# Patient Record
Sex: Female | Born: 1960
Health system: Southern US, Community
[De-identification: ages and names within clinical notes are randomized; demographics above are authoritative.]

## PROBLEM LIST (undated history)

## (undated) DIAGNOSIS — M170 Bilateral primary osteoarthritis of knee: Secondary | ICD-10-CM

## (undated) DIAGNOSIS — R7301 Impaired fasting glucose: Secondary | ICD-10-CM

## (undated) DIAGNOSIS — S43109A Unspecified dislocation of unspecified acromioclavicular joint, initial encounter: Secondary | ICD-10-CM

## (undated) DIAGNOSIS — Z86010 Personal history of colonic polyps: Secondary | ICD-10-CM

## (undated) DIAGNOSIS — S62209A Unspecified fracture of first metacarpal bone, unspecified hand, initial encounter for closed fracture: Secondary | ICD-10-CM

## (undated) DIAGNOSIS — Z9289 Personal history of other medical treatment: Secondary | ICD-10-CM

## (undated) DIAGNOSIS — Z8619 Personal history of other infectious and parasitic diseases: Secondary | ICD-10-CM

## (undated) DIAGNOSIS — L255 Unspecified contact dermatitis due to plants, except food: Secondary | ICD-10-CM

## (undated) DIAGNOSIS — I10 Essential (primary) hypertension: Secondary | ICD-10-CM

## (undated) DIAGNOSIS — N182 Chronic kidney disease, stage 2 (mild): Secondary | ICD-10-CM

## (undated) DIAGNOSIS — M159 Polyosteoarthritis, unspecified: Secondary | ICD-10-CM

## (undated) DIAGNOSIS — K760 Fatty (change of) liver, not elsewhere classified: Secondary | ICD-10-CM

## (undated) DIAGNOSIS — E039 Hypothyroidism, unspecified: Secondary | ICD-10-CM

## (undated) DIAGNOSIS — L237 Allergic contact dermatitis due to plants, except food: Secondary | ICD-10-CM

## (undated) DIAGNOSIS — K8020 Calculus of gallbladder without cholecystitis without obstruction: Secondary | ICD-10-CM

## (undated) DIAGNOSIS — A630 Anogenital (venereal) warts: Secondary | ICD-10-CM

## (undated) HISTORY — DX: Impaired fasting glucose: R73.01

## (undated) HISTORY — DX: Personal history of other medical treatment: Z92.89

## (undated) HISTORY — PX: COLONOSCOPY: SHX174

## (undated) HISTORY — DX: Personal history of colonic polyps: Z86.010

## (undated) HISTORY — DX: Polyosteoarthritis, unspecified: M15.9

## (undated) HISTORY — DX: Anogenital (venereal) warts: A63.0

## (undated) HISTORY — DX: Chronic kidney disease, stage 2 (mild): N18.2

## (undated) HISTORY — DX: Bilateral primary osteoarthritis of knee: M17.0

## (undated) HISTORY — DX: Unspecified contact dermatitis due to plants, except food: L25.5

## (undated) HISTORY — DX: Unspecified dislocation of unspecified acromioclavicular joint, initial encounter: S43.109A

## (undated) HISTORY — DX: Hypothyroidism, unspecified: E03.9

## (undated) HISTORY — DX: Unspecified fracture of first metacarpal bone, unspecified hand, initial encounter for closed fracture: S62.209A

## (undated) HISTORY — DX: Essential (primary) hypertension: I10

## (undated) HISTORY — DX: Personal history of other infectious and parasitic diseases: Z86.19

## (undated) HISTORY — DX: Allergic contact dermatitis due to plants, except food: L23.7

---
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
## 1982-09-14 DIAGNOSIS — Z9289 Personal history of other medical treatment: Secondary | ICD-10-CM

## 1982-09-14 HISTORY — PX: DILATION AND CURETTAGE OF UTERUS: SHX78

## 1982-09-14 HISTORY — DX: Personal history of other medical treatment: Z92.89

## 1984-09-14 HISTORY — PX: TUBAL LIGATION: SHX77

## 1985-09-14 HISTORY — PX: ABDOMINAL HYSTERECTOMY: SHX81

## 2009-09-14 DIAGNOSIS — Z860101 Personal history of adenomatous and serrated colon polyps: Secondary | ICD-10-CM

## 2009-09-14 DIAGNOSIS — Z86010 Personal history of colonic polyps: Secondary | ICD-10-CM

## 2009-09-14 HISTORY — DX: Personal history of adenomatous and serrated colon polyps: Z86.0101

## 2009-09-14 HISTORY — DX: Personal history of colonic polyps: Z86.010

## 2010-09-14 HISTORY — PX: OOPHORECTOMY: SHX86

## 2011-09-15 HISTORY — PX: TREATMENT FISTULA ANAL: SUR1390

## 2014-12-25 DIAGNOSIS — E039 Hypothyroidism, unspecified: Secondary | ICD-10-CM | POA: Insufficient documentation

## 2015-09-17 DIAGNOSIS — M1712 Unilateral primary osteoarthritis, left knee: Secondary | ICD-10-CM | POA: Insufficient documentation

## 2017-02-12 DIAGNOSIS — N182 Chronic kidney disease, stage 2 (mild): Secondary | ICD-10-CM

## 2017-02-12 HISTORY — DX: Chronic kidney disease, stage 2 (mild): N18.2

## 2017-02-26 ENCOUNTER — Encounter: Payer: Self-pay | Admitting: Family Medicine

## 2017-02-26 ENCOUNTER — Other Ambulatory Visit: Payer: Self-pay | Admitting: Family Medicine

## 2017-02-26 ENCOUNTER — Ambulatory Visit (INDEPENDENT_AMBULATORY_CARE_PROVIDER_SITE_OTHER): Payer: BLUE CROSS/BLUE SHIELD | Admitting: Family Medicine

## 2017-02-26 VITALS — BP 124/84 | HR 78 | Temp 98.0°F | Resp 16 | Ht 64.75 in | Wt 217.8 lb

## 2017-02-26 DIAGNOSIS — I10 Essential (primary) hypertension: Secondary | ICD-10-CM

## 2017-02-26 DIAGNOSIS — E039 Hypothyroidism, unspecified: Secondary | ICD-10-CM | POA: Diagnosis not present

## 2017-02-26 LAB — BASIC METABOLIC PANEL
BUN: 16 mg/dL (ref 6–23)
CHLORIDE: 106 meq/L (ref 96–112)
CO2: 28 mEq/L (ref 19–32)
CREATININE: 0.96 mg/dL (ref 0.40–1.20)
Calcium: 9.7 mg/dL (ref 8.4–10.5)
GFR: 63.59 mL/min (ref >60.00)
Glucose, Bld: 128 mg/dL — ABNORMAL HIGH (ref 70–99)
Potassium: 4.4 mEq/L (ref 3.5–5.1)
Sodium: 144 mEq/L (ref 135–145)

## 2017-02-26 LAB — TSH: TSH: 0.38 u[IU]/mL (ref 0.35–4.50)

## 2017-02-26 MED ORDER — LEVOTHYROXINE SODIUM 125 MCG PO TABS: 125.0000 ug | ORAL_TABLET | Freq: Every day | ORAL | 3 refills | Status: DC

## 2017-02-26 MED ORDER — AMLODIPINE BESYLATE 5 MG PO TABS: 5.0000 mg | ORAL_TABLET | Freq: Every day | ORAL | 3 refills | Status: DC

## 2017-02-26 NOTE — Progress Notes (Signed)
Office Note 03/01/2017  CC:  Chief Complaint  Patient presents with  . Establish Care  . Follow-up    thyroid and HTN   HPI:  Connie Thompson is a 56 y.o. female who is here to establish care Patient's most recent primary MD: [place]. Old records were not reviewed prior to or during today's visit.  Last labs were done 4 mo ago.  TSH must have been a little low, pt was told to take med 6 d/week instead of daily but she chose to continue daily treatment.  Takes levothyroxine daily/correctly.  Home monitoring of BP: not too much lately due to recent move/too busy and distracted.  Most recent pap was this year (2018).  Most recent mammogram was 2017.   Past Medical History:  Diagnosis Date  . Arthritis   . Chronic renal insufficiency, stage 2 (mild) 02/2017   GFR 60s  . Genital warts   . History of adenomatous polyp of colon 2011   Repeat colonoscopy 2017 normal.  Recall 2022.  [name]. History of blood transfusion 1984  . Hypertension   . Hypothyroidism     Past Surgical History:  Procedure Laterality Date  . ABDOMINAL HYSTERECTOMY  1987  . COLONOSCOPY  age 56 and again in 2017   Polyps on initial screening colonoscopy, no polyps 2017.  Recall 2022.  [name]. DILATION AND CURETTAGE OF UTERUS  1984   Molar Pregnancy  . OOPHORECTOMY Right 2012  . TONSILLECTOMY  1973  . TREATMENT FISTULA ANAL  2013  . TUBAL LIGATION  1986  . WISDOM TOOTH EXTRACTION  1975    Family History  Problem Relation Age of Onset  . Alcohol abuse Mother   . Stroke Mother   . Hypertension Mother   . Alcohol abuse Father   . Hypertension Father   . COPD Brother   . Lung cancer Maternal Uncle        smoker  . Alcohol abuse Maternal Uncle   . Stroke Maternal Grandmother   . Hypertension Maternal Grandmother   . Hypertension Brother     Social History   Social History  . Marital status: Married    Spouse name: N/A  . Number of children: N/A  . Years of education: N/A   Occupational  History  . Not on file.   Social History Main Topics  . Smoking status: Never Smoker  . Smokeless tobacco: Never Used  . Alcohol use 4.2 oz/week    7 Glasses of wine per week  . Drug use: No  . Sexual activity: Not on file   Other Topics Concern  . Not on file   Social History Narrative   Married, 2 grown children, 2 GC.   Moved to [place] area 12/2016.  Pt originally from [place].   Educ: 2 yr college.   Occup: homemaker   No tobacco.   Occ alcohol.    Outpatient Encounter Prescriptions as of 02/26/2017  Medication Sig  . [DISCONTINUED] amLODipine (NORVASC) 5 MG tablet Take 5 mg by mouth daily.  . [DISCONTINUED] levothyroxine (SYNTHROID, LEVOTHROID) 125 MCG tablet Take 1 tablet by mouth daily.   No facility-administered encounter medications on file as of 02/26/2017.     Allergies  Allergen Reactions  . Sulfa Antibiotics Hives    ROS Review of Systems  Constitutional: Negative for fatigue and fever.  HENT: Negative for congestion and sore throat.   Eyes: Negative for visual disturbance.  Respiratory: Negative for cough.   Cardiovascular: Negative for  chest pain.  Gastrointestinal: Negative for abdominal pain and nausea.  Genitourinary: Negative for dysuria.  Musculoskeletal: Negative for back pain and joint swelling.  Skin: Negative for rash.  Neurological: Negative for weakness and headaches.  Hematological: Negative for adenopathy.    PE; Blood pressure 124/84, pulse 78, temperature 98 F (36.7 C), temperature source Oral, resp. rate 16, height 5' 4.75" (1.645 m), weight 217 lb 12 oz (98.8 kg), SpO2 95 %. Gen: Alert, well appearing.  Patient is oriented to person, place, time, and situation. AFFECT: pleasant, lucid thought and speech. ZOX:WRUE: no injection, icteris, swelling, or exudate.  EOMI, PERRLA. Mouth: lips without lesion/swelling.  Oral mucosa pink and moist. Oropharynx without erythema, exudate, or swelling.  CV: RRR, no m/r/g.   LUNGS: CTA bilat,  nonlabored resps, good aeration in all lung fields. EXT: no clubbing, cyanosis, or edema.   Pertinent labs:  None today  ASSESSMENT AND PLAN:   New pt; obtain prior PCP records.  1) Hypothyroidism: last TSH slightly low per pt.  She did not adjust her dosing schedule as she was told by her previous PCP.  Check TSH today.  2) HTN: The current medical regimen is effective;  continue present plan and medications. Check lytes/cr today.  An After Visit Summary was printed and given to the patient.  Return in about 6 months (around 08/28/2017) for annual CPE (fasting).  Signed:  Santiago Bumpers, MD           03/01/2017

## 2017-03-01 ENCOUNTER — Encounter: Payer: Self-pay | Admitting: *Deleted

## 2017-03-01 ENCOUNTER — Encounter: Payer: Self-pay | Admitting: Family Medicine

## 2017-04-20 ENCOUNTER — Encounter: Payer: Self-pay | Admitting: Family Medicine

## 2017-04-20 ENCOUNTER — Ambulatory Visit (INDEPENDENT_AMBULATORY_CARE_PROVIDER_SITE_OTHER): Payer: BLUE CROSS/BLUE SHIELD | Admitting: Family Medicine

## 2017-04-20 ENCOUNTER — Ambulatory Visit: Payer: BLUE CROSS/BLUE SHIELD | Admitting: Family Medicine

## 2017-04-20 VITALS — BP 131/70 | HR 67 | Temp 97.9°F | Resp 16 | Ht 64.75 in | Wt 216.5 lb

## 2017-04-20 DIAGNOSIS — L237 Allergic contact dermatitis due to plants, except food: Secondary | ICD-10-CM

## 2017-04-20 MED ORDER — PREDNISONE 20 MG PO TABS: ORAL_TABLET | ORAL | 0 refills | Status: DC

## 2017-04-20 NOTE — Progress Notes (Signed)
OFFICE VISIT  04/20/2017   CC:  Chief Complaint  Patient presents with  . Rash     HPI:    Patient is a 56 y.o.  female who presents for rash. Working in yard 3-4 days ago, in bushes, noted one night she started to have rash in intergluteal region that itched a lot. Yesterday morning she noted it begin to spread onto legs, trunk.   No fever, no malaise, no pain. No swelling of eyes, tongue, throat.  No wheezing or SOB. Took benadryl and this helped some with the itching.  Nothing applied topically.  + Hx of allergy to poison ivy/oak.  Past Medical History:  Diagnosis Date  . Arthritis   . Chronic renal insufficiency, stage 2 (mild) 02/2017   GFR 60s  . Genital warts   . History of adenomatous polyp of colon 2011   Repeat colonoscopy 2017 normal.  Recall 2022.  [name] History of blood transfusion 1984  . Hypertension   . Hypothyroidism     Past Surgical History:  Procedure Laterality Date  . ABDOMINAL HYSTERECTOMY  1987  . COLONOSCOPY  age 62 and again in 2017   Polyps on initial screening colonoscopy, no polyps 2017.  Recall 2022.  [name] DILATION AND CURETTAGE OF UTERUS  1984   Molar Pregnancy  . OOPHORECTOMY Right 2012  . TONSILLECTOMY  1973  . TREATMENT FISTULA ANAL  2013  . TUBAL LIGATION  1986  . WISDOM TOOTH EXTRACTION  1975    Outpatient Medications Prior to Visit  Medication Sig Dispense Refill  . amLODipine (NORVASC) 5 MG tablet Take 1 tablet (5 mg total) by mouth daily. 90 tablet 3  . levothyroxine (SYNTHROID, LEVOTHROID) 125 MCG tablet Take 1 tablet (125 mcg total) by mouth daily. 90 tablet 3   No facility-administered medications prior to visit.     Allergies  Allergen Reactions  . Sulfa Antibiotics Hives    ROS As per HPI  PE: Blood pressure 131/70, pulse 67, temperature 97.9 F (36.6 C), temperature source Oral, resp. rate 16, height 5' 4.75" (1.645 m), weight 216 lb 8 oz (98.2 kg), SpO2 99 %. Gen: Alert, well appearing.  Patient is oriented to  person, place, time, and situation. AFFECT: pleasant, lucid thought and speech. SKIN: diffusely scattered pink papular rash on lower legs, around elbows, covering abd and low/mid back. Some areas of small hive-like lesions on lower back.  No petechia, no vesicles, no pusutles.  LABS:    Chemistry      Component Value Date/Time   NA 144 02/26/2017 1424   K 4.4 02/26/2017 1424   CL 106 02/26/2017 1424   CO2 28 02/26/2017 1424   BUN 16 02/26/2017 1424   CREATININE 0.96 02/26/2017 1424      Component Value Date/Time   CALCIUM 9.7 02/26/2017 1424     Lab Results  Component Value Date   TSH 0.38 02/26/2017   IMPRESSION AND PLAN:  Allergic contact derm--poison ivy/oak. Prednisone 40mg  x 5d, then 20mg  x 5d, then 10mg  x 6d. Instructions: Buy otc generic allegra 180 mg tab: take 1 tab daily for itching. You may take a 25mg  dose of benadryl at bedtime as well, if needed. Therapeutic expectations and side effect profile of medication discussed today.  Patient's questions answered. Signs/symptoms to call or return for were reviewed and pt expressed understanding.  An After Visit Summary was printed and given to the patient.   FOLLOW UP: Return if symptoms worsen or fail to improve.  Signed:  Santiago BumpersPhil McGowen, MD           04/20/2017

## 2017-04-20 NOTE — Patient Instructions (Signed)
Buy otc generic allegra 180 mg tab: take 1 tab daily for itching. You may take a 25mg  dose of benadryl at bedtime as well, if needed.

## 2017-04-23 ENCOUNTER — Encounter: Payer: Self-pay | Admitting: *Deleted

## 2017-05-14 ENCOUNTER — Ambulatory Visit (INDEPENDENT_AMBULATORY_CARE_PROVIDER_SITE_OTHER): Payer: BLUE CROSS/BLUE SHIELD | Admitting: Family Medicine

## 2017-05-14 ENCOUNTER — Encounter: Payer: Self-pay | Admitting: Family Medicine

## 2017-05-14 VITALS — BP 128/77 | HR 68 | Temp 97.8°F | Resp 16 | Wt 217.0 lb

## 2017-05-14 DIAGNOSIS — L237 Allergic contact dermatitis due to plants, except food: Secondary | ICD-10-CM

## 2017-05-14 MED ORDER — PREDNISONE 10 MG PO TABS: ORAL_TABLET | ORAL | 0 refills | Status: DC

## 2017-05-14 NOTE — Progress Notes (Signed)
OFFICE VISIT  05/14/2017   CC:  Chief Complaint  Patient presents with  . Rash    legs and under arms   HPI:    Patient is a 56 y.o.  female who presents for rash.   Of note, I dx'd her with contact derm from poison ivy about 3 wks ago, rx'd a 16 day taper of prednisone, +allegra. She took her last prednisone 1 week ago.  Rash completely resolved early on in the prednisone course --w/in 1 week, but she did finish out the taper.  Rash returned on lower legs 2 d/a--very itchy. Has slight "heat rash" in axillary and abd pannus region, where she sweats more.  No new contact with poison ivy/oak or other new substance. No fevers/malaise. No joint aches or myalgias.  No HA.  Past Medical History:  Diagnosis Date  . Allergic dermatitis due to poison vine   . Arthritis   . Chronic renal insufficiency, stage 2 (mild) 02/2017   GFR 60s  . Genital warts   . History of adenomatous polyp of colon 2011   Repeat colonoscopy 2017 normal.  Recall 2022.  [name]. History of blood transfusion 1984  . Hypertension   . Hypothyroidism     Past Surgical History:  Procedure Laterality Date  . ABDOMINAL HYSTERECTOMY  1987  . COLONOSCOPY  age 56 and again in 2017   Polyps on initial screening colonoscopy, no polyps 2017.  Recall 2022.  [name]. DILATION AND CURETTAGE OF UTERUS  1984   Molar Pregnancy  . OOPHORECTOMY Right 2012  . TONSILLECTOMY  1973  . TREATMENT FISTULA ANAL  2013  . TUBAL LIGATION  1986  . WISDOM TOOTH EXTRACTION  1975    Outpatient Medications Prior to Visit  Medication Sig Dispense Refill  . amLODipine (NORVASC) 5 MG tablet Take 1 tablet (5 mg total) by mouth daily. 90 tablet 3  . levothyroxine (SYNTHROID, LEVOTHROID) 125 MCG tablet Take 1 tablet (125 mcg total) by mouth daily. 90 tablet 3  . predniSONE (DELTASONE) 20 MG tablet 2 tabs po qd x 5d, then 1 tab po qd x 5d, then 1/2 tab po qd x 6d (Patient not taking: Reported on 05/14/2017) 18 tablet 0   No facility-administered  medications prior to visit.     Allergies  Allergen Reactions  . Sulfa Antibiotics Hives    ROS As per HPI  PE: Blood pressure 128/77, pulse 68, temperature 97.8 F (36.6 C), temperature source Oral, resp. rate 16, weight 217 lb (98.4 kg), SpO2 95 %. Gen: Alert, well appearing.  Patient is oriented to person, place, time, and situation. AFFECT: pleasant, lucid thought and speech. SKIN: erythematous, fine papular rash on pretibial areas, R>L.  Partial blanching noted with pressure. No petechiae, pustules, or vesicles.  No hives.    LABS:    Chemistry      Component Value Date/Time   NA 144 02/26/2017 1424   K 4.4 02/26/2017 1424   CL 106 02/26/2017 1424   CO2 28 02/26/2017 1424   BUN 16 02/26/2017 1424   CREATININE 0.96 02/26/2017 1424      Component Value Date/Time   CALCIUM 9.7 02/26/2017 1424       IMPRESSION AND PLAN:  Allergic dermatitis from poison ivy: residual inflammatory response leading to slight recurrence of rash. Will proceed with another prednisone taper even though the surface area involved is fairly small--to try to avoid rebound of the process.  However, I encouraged pt to try 20mg --10mg --5 mg taper and  if not responding then do the 40--20--10 taper as rx'd. Continue allegra 180mg  prn itching. OTC hydrocortisone ointment bid prn.  An After Visit Summary was printed and given to the patient.  FOLLOW UP: Return if symptoms worsen or fail to improve.  Signed:  Santiago Bumpers, MD           05/14/2017

## 2017-05-15 DIAGNOSIS — S62209A Unspecified fracture of first metacarpal bone, unspecified hand, initial encounter for closed fracture: Secondary | ICD-10-CM

## 2017-05-15 HISTORY — DX: Unspecified fracture of first metacarpal bone, unspecified hand, initial encounter for closed fracture: S62.209A

## 2017-06-01 ENCOUNTER — Ambulatory Visit (INDEPENDENT_AMBULATORY_CARE_PROVIDER_SITE_OTHER): Payer: BLUE CROSS/BLUE SHIELD | Admitting: Family Medicine

## 2017-06-01 ENCOUNTER — Other Ambulatory Visit: Payer: Self-pay | Admitting: Family Medicine

## 2017-06-01 ENCOUNTER — Encounter: Payer: Self-pay | Admitting: Family Medicine

## 2017-06-01 ENCOUNTER — Ambulatory Visit (HOSPITAL_BASED_OUTPATIENT_CLINIC_OR_DEPARTMENT_OTHER)
Admission: RE | Admit: 2017-06-01 | Discharge: 2017-06-01 | Disposition: A | Discharge status: Home / Self Care | Payer: BLUE CROSS/BLUE SHIELD | Source: Ambulatory Visit | Attending: Family Medicine | Admitting: Family Medicine

## 2017-06-01 VITALS — BP 139/84 | HR 60 | Temp 97.9°F | Resp 16 | Wt 217.0 lb

## 2017-06-01 DIAGNOSIS — X58XXXA Exposure to other specified factors, initial encounter: Secondary | ICD-10-CM | POA: Insufficient documentation

## 2017-06-01 DIAGNOSIS — S60011A Contusion of right thumb without damage to nail, initial encounter: Secondary | ICD-10-CM | POA: Diagnosis not present

## 2017-06-01 DIAGNOSIS — M79644 Pain in right finger(s): Secondary | ICD-10-CM | POA: Diagnosis not present

## 2017-06-01 DIAGNOSIS — S6991XA Unspecified injury of right wrist, hand and finger(s), initial encounter: Secondary | ICD-10-CM | POA: Diagnosis not present

## 2017-06-01 DIAGNOSIS — S62234A Other nondisplaced fracture of base of first metacarpal bone, right hand, initial encounter for closed fracture: Secondary | ICD-10-CM

## 2017-06-01 NOTE — Progress Notes (Signed)
OFFICE VISIT  06/01/2017   CC:  Chief Complaint  Patient presents with  . Hand Pain    Fell on  hands, right hand pain   HPI:    Patient is a 56 y.o.  female who presents for right hand pain. Was in [place] touring a Psychologist, counselling, tripped over Administrator, arts floor, feel straight forward onto both hands. This occurred 5 d/a.  Both hands hurt and bruised initially.  Left hand has returned to normal--some bruising but no pain. Right hand at base of the thumb still hurts significantly, worsens with ROM/grip.  No radiation of the pain.  Some swelling at the area of base of thumb on R hand. Tylenol yesterday 1000 mg no help. Denies tingling or numbness in R thumb or hand.  Past Medical History:  Diagnosis Date  . Allergic dermatitis due to poison vine   . Arthritis   . Chronic renal insufficiency, stage 2 (mild) 02/2017   GFR 60s  . Genital warts   . History of adenomatous polyp of colon 2011   Repeat colonoscopy 2017 normal.  Recall 2022.  [name] History of blood transfusion 1984  . Hypertension   . Hypothyroidism     Past Surgical History:  Procedure Laterality Date  . ABDOMINAL HYSTERECTOMY  1987  . COLONOSCOPY  age 32 and again in 2017   Polyps on initial screening colonoscopy, no polyps 2017.  Recall 2022.  [name] DILATION AND CURETTAGE OF UTERUS  1984   Molar Pregnancy  . OOPHORECTOMY Right 2012  . TONSILLECTOMY  1973  . TREATMENT FISTULA ANAL  2013  . TUBAL LIGATION  1986  . WISDOM TOOTH EXTRACTION  1975    Outpatient Medications Prior to Visit  Medication Sig Dispense Refill  . amLODipine (NORVASC) 5 MG tablet Take 1 tablet (5 mg total) by mouth daily. 90 tablet 3  . levothyroxine (SYNTHROID, LEVOTHROID) 125 MCG tablet Take 1 tablet (125 mcg total) by mouth daily. 90 tablet 3  . predniSONE (DELTASONE) 10 MG tablet 4 tabs po qd x 5 days, then 2 tabs po qd x 5 days, then 1 tab po qd x 5 days (Patient not taking: Reported on 06/01/2017) 35 tablet 0   No facility-administered  medications prior to visit.     Allergies  Allergen Reactions  . Sulfa Antibiotics Hives    ROS As per HPI  PE: Blood pressure 139/84, pulse 60, temperature 97.9 F (36.6 C), temperature source Oral, resp. rate 16, weight 217 lb (98.4 kg), SpO2 98 %. Gen: Alert, well appearing.  Patient is oriented to person, place, time, and situation. AFFECT: pleasant, lucid thought and speech. Left hand thenar eminence with minimal ecchymotic discoloration but no swelling or tenderness. Right hand: mild ecchymoses and swelling over volar aspect of thumb metacarpal and thenar eminence. Signif TTP over volar aspect of thumb metacarpal.  No wrist tenderness, no anatomic snuff box tenderness. No phalanx tenderness or swelling.  Grip strength 5/5 prox/dist bilat.  N/V intact. Wrist ROM intact and w/out pain.  LABS:    Chemistry      Component Value Date/Time   NA 144 02/26/2017 1424   K 4.4 02/26/2017 1424   CL 106 02/26/2017 1424   CO2 28 02/26/2017 1424   BUN 16 02/26/2017 1424   CREATININE 0.96 02/26/2017 1424      Component Value Date/Time   CALCIUM 9.7 02/26/2017 1424      IMPRESSION AND PLAN:  Right thumb metacarpal contusion. X-ray R thumb ordered. Fitted pt  in large thumb spica splint today and gave instructions regarding wearing this. Ice 20-30 min twice a day. Possible ortho referral depending on x-ray results. She will take ibup or tylenol prn for pain.  An After Visit Summary was printed and given to the patient.  FOLLOW UP: Return in about 2 weeks (around 06/15/2017) for f/u thumb injury.  Signed:  Santiago Bumpers, MD           06/01/2017

## 2017-06-07 DIAGNOSIS — M180 Bilateral primary osteoarthritis of first carpometacarpal joints: Secondary | ICD-10-CM | POA: Diagnosis not present

## 2017-06-07 DIAGNOSIS — M79644 Pain in right finger(s): Secondary | ICD-10-CM | POA: Diagnosis not present

## 2017-06-15 ENCOUNTER — Ambulatory Visit: Payer: BLUE CROSS/BLUE SHIELD | Admitting: Family Medicine

## 2017-06-21 DIAGNOSIS — M180 Bilateral primary osteoarthritis of first carpometacarpal joints: Secondary | ICD-10-CM | POA: Diagnosis not present

## 2017-06-21 DIAGNOSIS — M79644 Pain in right finger(s): Secondary | ICD-10-CM | POA: Diagnosis not present

## 2017-06-23 DIAGNOSIS — M1712 Unilateral primary osteoarthritis, left knee: Secondary | ICD-10-CM | POA: Diagnosis not present

## 2017-06-24 ENCOUNTER — Ambulatory Visit (INDEPENDENT_AMBULATORY_CARE_PROVIDER_SITE_OTHER): Payer: BLUE CROSS/BLUE SHIELD

## 2017-06-24 DIAGNOSIS — Z23 Encounter for immunization: Secondary | ICD-10-CM

## 2017-07-23 DIAGNOSIS — M1711 Unilateral primary osteoarthritis, right knee: Secondary | ICD-10-CM | POA: Diagnosis not present

## 2017-07-23 DIAGNOSIS — M1712 Unilateral primary osteoarthritis, left knee: Secondary | ICD-10-CM | POA: Diagnosis not present

## 2017-08-27 DIAGNOSIS — M1712 Unilateral primary osteoarthritis, left knee: Secondary | ICD-10-CM | POA: Diagnosis not present

## 2017-09-01 ENCOUNTER — Encounter: Payer: BLUE CROSS/BLUE SHIELD | Admitting: Family Medicine

## 2017-09-01 DIAGNOSIS — M1712 Unilateral primary osteoarthritis, left knee: Secondary | ICD-10-CM | POA: Diagnosis not present

## 2017-09-08 DIAGNOSIS — M1712 Unilateral primary osteoarthritis, left knee: Secondary | ICD-10-CM | POA: Diagnosis not present

## 2017-10-27 DIAGNOSIS — M1712 Unilateral primary osteoarthritis, left knee: Secondary | ICD-10-CM | POA: Diagnosis not present

## 2017-12-01 ENCOUNTER — Other Ambulatory Visit: Payer: Self-pay | Admitting: Family Medicine

## 2018-01-10 ENCOUNTER — Encounter: Payer: Self-pay | Admitting: Family Medicine

## 2018-02-10 ENCOUNTER — Other Ambulatory Visit: Payer: Self-pay | Admitting: Family Medicine

## 2018-02-10 NOTE — Telephone Encounter (Signed)
Pt advised and voiced understanding.  Apt made for 02/23/18 at 9:30am.

## 2018-02-10 NOTE — Telephone Encounter (Signed)
Pt is over due for f/u RCI, will send Rx for #30 w/ 0RF, needs office visit for f/u for more refills.   Left message for pt to call back.

## 2018-02-23 ENCOUNTER — Ambulatory Visit: Payer: BLUE CROSS/BLUE SHIELD | Admitting: Family Medicine

## 2018-02-28 ENCOUNTER — Ambulatory Visit: Payer: BLUE CROSS/BLUE SHIELD | Admitting: Family Medicine

## 2018-03-02 ENCOUNTER — Ambulatory Visit (INDEPENDENT_AMBULATORY_CARE_PROVIDER_SITE_OTHER): Payer: BLUE CROSS/BLUE SHIELD | Admitting: Family Medicine

## 2018-03-02 ENCOUNTER — Encounter: Payer: Self-pay | Admitting: Family Medicine

## 2018-03-02 VITALS — BP 133/74 | HR 56 | Temp 97.8°F | Resp 16 | Ht 64.5 in | Wt 218.0 lb

## 2018-03-02 DIAGNOSIS — E039 Hypothyroidism, unspecified: Secondary | ICD-10-CM

## 2018-03-02 DIAGNOSIS — Z23 Encounter for immunization: Secondary | ICD-10-CM | POA: Diagnosis not present

## 2018-03-02 DIAGNOSIS — N182 Chronic kidney disease, stage 2 (mild): Secondary | ICD-10-CM

## 2018-03-02 DIAGNOSIS — E669 Obesity, unspecified: Secondary | ICD-10-CM | POA: Diagnosis not present

## 2018-03-02 DIAGNOSIS — I10 Essential (primary) hypertension: Secondary | ICD-10-CM

## 2018-03-02 LAB — TSH: TSH: 0.78 u[IU]/mL (ref 0.35–4.50)

## 2018-03-02 LAB — COMPREHENSIVE METABOLIC PANEL
ALT: 15 U/L (ref 0–35)
AST: 14 U/L (ref 0–37)
Albumin: 4.2 g/dL (ref 3.5–5.2)
Alkaline Phosphatase: 61 U/L (ref 39–117)
BUN: 11 mg/dL (ref 6–23)
CALCIUM: 9.4 mg/dL (ref 8.4–10.5)
CHLORIDE: 107 meq/L (ref 96–112)
CO2: 30 meq/L (ref 19–32)
Creatinine, Ser: 0.78 mg/dL (ref 0.40–1.20)
GFR: 80.52 mL/min (ref >60.00)
Glucose, Bld: 106 mg/dL — ABNORMAL HIGH (ref 70–99)
Potassium: 4.3 mEq/L (ref 3.5–5.1)
SODIUM: 143 meq/L (ref 135–145)
Total Bilirubin: 0.4 mg/dL (ref 0.2–1.2)
Total Protein: 6.5 g/dL (ref 6.0–8.3)

## 2018-03-02 LAB — LIPID PANEL
CHOLESTEROL: 172 mg/dL (ref 0–200)
HDL: 53.2 mg/dL (ref >39.00)
LDL CALC: 99 mg/dL (ref 0–99)
NonHDL: 119.16
Total CHOL/HDL Ratio: 3
Triglycerides: 102 mg/dL (ref 0.0–149.0)
VLDL: 20.4 mg/dL (ref 0.0–40.0)

## 2018-03-02 NOTE — Progress Notes (Signed)
OFFICE VISIT  03/02/2018   CC:  Chief Complaint  Patient presents with  . Follow-up    RCI, pt is fasting.      HPI:    Patient is a 57 y.o. Caucasian female who presents for f/u HTN, CRI with GFR in the 60s, and hypothyroidism. Adult children moved home 6 mo ago and this has been a bit stressful.  They are moving out in 2 wks though.  HTN: has not been checking home bp.  Compliant with med daily. Active but no formal exercise. Does not eat low sodium diet.  She is juicing.  More fruits and veggies from her garden.  CRI: takes 3 advil once a day due to her osteoarthritis. Hydrates well.  Hypoth: takes this on empty stomach at same time as bp med but w/out vitamins or minerals or PPI.  ROS: GERD--maalox helps.  No abd pain, no melena/hematochezia.  No CP, no SOB, no palpitations, no dizziness, no HAs, no LE swelling.  Past Medical History:  Diagnosis Date  . Allergic dermatitis due to poison vine   . Chronic renal insufficiency, stage 2 (mild) 02/2017   GFR 60s  . First metacarpal bone fracture 05/2017   base, extraarticular--thumb spica placed and referred pt to orthopedics.  . Genital warts   . History of adenomatous polyp of colon 2011   Repeat colonoscopy 2017 normal.  Recall 2022.  [name] History of blood transfusion 1984  . Hypertension   . Hypothyroidism   . Osteoarthritis of both knees    L>R--hx of visco injections.   Steroid inj 12/07/17 at ortho.    Past Surgical History:  Procedure Laterality Date  . ABDOMINAL HYSTERECTOMY  1987  . COLONOSCOPY  age 41 and again in 2017   Polyps on initial screening colonoscopy, no polyps 2017.  Recall 2022.  [name] DILATION AND CURETTAGE OF UTERUS  1984   Molar Pregnancy  . OOPHORECTOMY Right 2012  . TONSILLECTOMY  1973  . TREATMENT FISTULA ANAL  2013  . TUBAL LIGATION  1986  . WISDOM TOOTH EXTRACTION  1975    Outpatient Medications Prior to Visit  Medication Sig Dispense Refill  . amLODipine (NORVASC) 5 MG tablet TAKE 1  TABLET BY MOUTH DAILY. 30 tablet 0  . ibuprofen (ADVIL) 200 MG tablet Take 200 mg by mouth every 6 (six) hours as needed.    [name] levothyroxine (SYNTHROID, LEVOTHROID) 125 MCG tablet TAKE 1 TABLET BY MOUTH DAILY. *NEEDS APPT FOR FURTHER REFILLS* 30 tablet 0   No facility-administered medications prior to visit.     Allergies  Allergen Reactions  . Sulfa Antibiotics Hives    ROS As per HPI  PE: Blood pressure 133/74, pulse (!) 56, temperature 97.8 F (36.6 C), temperature source Oral, resp. rate 16, height 5' 4.5" (1.638 m), weight 218 lb (98.9 kg), SpO2 95 %. Body mass index is 36.84 kg/m.  Gen: Alert, well appearing.  Patient is oriented to person, place, time, and situation. AFFECT: pleasant, lucid thought and speech. CV: RRR, no m/r/g.   LUNGS: CTA bilat, nonlabored resps, good aeration in all lung fields. EXT: no clubbing or cyanosis.  1+ pitting edema R LL, trace pitting edema L LL.   LABS:  Lab Results  Component Value Date   TSH 0.38 02/26/2017   No results found for: WBC, HGB, HCT, MCV, PLT Lab Results  Component Value Date   CREATININE 0.96 02/26/2017   BUN 16 02/26/2017   NA 144 02/26/2017   K 4.4  02/26/2017   CL 106 02/26/2017   CO2 28 02/26/2017     IMPRESSION AND PLAN:  1) HTN: control good as per office measurement. Encouraged home bp monitoring.  Work on [employer]. BMET today. Check FLP today.  2) Hypothyroidism: taking med correctly. Recheck TSH today.  3) CRI stage II (GFR 60s).  She is minimizing NSAIDs--has to use ibup qd due to osteoarthritic pain. BMET today.  Hydrate well.  An After Visit Summary was printed and given to the patient.  FOLLOW UP: Return for 3-6 mo for CPE.  Signed:  Santiago BumpersPhil Kimmerly Lora, MD           03/02/2018

## 2018-03-02 NOTE — Addendum Note (Signed)
Addended by: Smitty KnudsenSUTHERLAND, Joycelynn Fritsche K on: 03/02/2018 09:30 AM   Modules accepted: Orders

## 2018-03-03 ENCOUNTER — Other Ambulatory Visit (INDEPENDENT_AMBULATORY_CARE_PROVIDER_SITE_OTHER): Payer: BLUE CROSS/BLUE SHIELD

## 2018-03-03 ENCOUNTER — Encounter: Payer: Self-pay | Admitting: Family Medicine

## 2018-03-03 DIAGNOSIS — R7301 Impaired fasting glucose: Secondary | ICD-10-CM

## 2018-03-03 LAB — HEMOGLOBIN A1C: HEMOGLOBIN A1C: 5.7 % (ref 4.6–6.5)

## 2018-03-03 NOTE — Progress Notes (Signed)
Test added on and sent to lab

## 2018-03-08 ENCOUNTER — Telehealth: Payer: Self-pay | Admitting: Family Medicine

## 2018-03-08 NOTE — Telephone Encounter (Signed)
Please notify: all labs came back normal.  

## 2018-03-09 NOTE — Telephone Encounter (Signed)
Patient notified and verbalized understanding. 

## 2018-03-14 ENCOUNTER — Encounter: Payer: Self-pay | Admitting: Family Medicine

## 2018-03-14 ENCOUNTER — Ambulatory Visit (INDEPENDENT_AMBULATORY_CARE_PROVIDER_SITE_OTHER): Payer: BLUE CROSS/BLUE SHIELD | Admitting: Family Medicine

## 2018-03-14 VITALS — BP 125/72 | HR 63 | Temp 98.0°F | Resp 16 | Ht 64.5 in | Wt 224.1 lb

## 2018-03-14 DIAGNOSIS — J069 Acute upper respiratory infection, unspecified: Secondary | ICD-10-CM | POA: Diagnosis not present

## 2018-03-14 DIAGNOSIS — J209 Acute bronchitis, unspecified: Secondary | ICD-10-CM

## 2018-03-14 MED ORDER — HYDROCODONE-HOMATROPINE 5-1.5 MG/5ML PO SYRP: ORAL_SOLUTION | ORAL | 0 refills | Status: DC

## 2018-03-14 MED ORDER — PREDNISONE 20 MG PO TABS: ORAL_TABLET | ORAL | 0 refills | Status: DC

## 2018-03-14 MED ORDER — ALBUTEROL SULFATE HFA 108 (90 BASE) MCG/ACT IN AERS: 2.0000 | INHALATION_SPRAY | Freq: Four times a day (QID) | RESPIRATORY_TRACT | 0 refills | Status: DC | PRN

## 2018-03-14 NOTE — Progress Notes (Signed)
OFFICE VISIT  03/14/2018   CC:  Chief Complaint  Patient presents with  . Cough    had low grade fever for several days before cough started   HPI:    Patient is a 57 y.o.  female never smoker who presents for respiratory complaints. Temp was 99.5-99.9 onset 1 week ago, lasted 3-4d, had a headache and ST, then started getting sneezing, PND, cough, some wheezing.  Fever, HA, and ST are now gone. No n/v/d. No known sick contacts except granddaughter with 1d ST/fever prior to [place] getting sick. No CP, no hemoptysis. No SOB.  Past Medical History:  Diagnosis Date  . Allergic dermatitis due to poison vine   . Chronic renal insufficiency, stage 2 (mild) 02/2017   GFR 60s  . First metacarpal bone fracture 05/2017   base, extraarticular--thumb spica placed and referred pt to orthopedics.  . Genital warts   . History of adenomatous polyp of colon 2011   Repeat colonoscopy 2017 normal.  Recall 2022.  [name] History of blood transfusion 1984  . Hypertension   . Hypothyroidism   . IFG (impaired fasting glucose)    HbA1c 5.7% June 2019  . Osteoarthritis of both knees    L>R--hx of visco injections.   Steroid inj 12/07/17 at ortho.    Past Surgical History:  Procedure Laterality Date  . ABDOMINAL HYSTERECTOMY  1987  . COLONOSCOPY  age 11 and again in 2017   Polyps on initial screening colonoscopy, no polyps 2017.  Recall 2022.  [name] DILATION AND CURETTAGE OF UTERUS  1984   Molar Pregnancy  . OOPHORECTOMY Right 2012  . TONSILLECTOMY  1973  . TREATMENT FISTULA ANAL  2013  . TUBAL LIGATION  1986  . WISDOM TOOTH EXTRACTION  1975   . Outpatient Medications Prior to Visit  Medication Sig Dispense Refill  . amLODipine (NORVASC) 5 MG tablet TAKE 1 TABLET BY MOUTH DAILY. 30 tablet 0  . ibuprofen (ADVIL) 200 MG tablet Take 200 mg by mouth every 6 (six) hours as needed.    [name] levothyroxine (SYNTHROID, LEVOTHROID) 125 MCG tablet TAKE 1 TABLET BY MOUTH DAILY. *NEEDS APPT FOR FURTHER REFILLS* 30  tablet 0   No facility-administered medications prior to visit.     Allergies  Allergen Reactions  . Sulfa Antibiotics Hives    ROS As per HPI  PE: Blood pressure 125/72, pulse 63, temperature 98 F (36.7 C), temperature source Oral, resp. rate 16, height 5' 4.5" (1.638 m), weight 224 lb 2 oz (101.7 kg), SpO2 94 %. VS: noted--normal. Gen: alert, NAD, NONTOXIC APPEARING. HEENT: eyes without injection, drainage, or swelling.  Ears: EACs clear, TMs with normal light reflex and landmarks.  Nose: Clear rhinorrhea, with some dried, crusty exudate adherent to mildly injected mucosa.  No purulent d/c.  No paranasal sinus TTP.  No facial swelling.  Throat and mouth without focal lesion.  No pharyngial swelling, erythema, or exudate.   Neck: supple, no LAD.   LUNGS: CTA bilat on inspiration, but with exhalation she has mid-to-end exp wheezing (coarse) and midly prolonged exp phase.  This does not clear with coughing.   Nonlabored resps.   CV: RRR, no m/r/g. EXT: no c/c/e SKIN: no rash    LABS:    Chemistry      Component Value Date/Time   NA 143 03/02/2018 0922   K 4.3 03/02/2018 0922   CL 107 03/02/2018 0922   CO2 30 03/02/2018 0922   BUN 11 03/02/2018 0922   CREATININE  0.78 03/02/2018 0922      Component Value Date/Time   CALCIUM 9.4 03/02/2018 0922   ALKPHOS 61 03/02/2018 0922   AST 14 03/02/2018 0922   ALT 15 03/02/2018 0922   BILITOT 0.4 03/02/2018 0922       IMPRESSION AND PLAN:  Viral URI with acute viral bronchitis (w/RAD component). Prednisone 40mg  qd x 5d, then 20mg  qd x 5d. Albuterol HFA, 1-2 puffs q6h prn. Hycodan syrup, 1-2 tsp qhs prn, #120 ml--eRx'd. Get otc generic robitussin DM OR Mucinex DM and use as directed on the packaging for cough and congestion. Use otc generic saline nasal spray 2-3 times per day to irrigate/moisturize your nasal passages.  An After Visit Summary was printed and given to the patient.  FOLLOW UP: Return if symptoms worsen or  fail to improve.  Signed:  Santiago BumpersPhil McGowen, MD           03/14/2018

## 2018-03-14 NOTE — Patient Instructions (Signed)
Get otc generic robitussin DM OR Mucinex DM and use as directed on the packaging for cough and congestion. Use otc generic saline nasal spray 2-3 times per day to irrigate/moisturize your nasal passages.  Save your hydrocodone cough syrup for BEDTIME USE ONLY.

## 2018-03-22 ENCOUNTER — Other Ambulatory Visit: Payer: Self-pay | Admitting: Family Medicine

## 2018-03-22 NOTE — Telephone Encounter (Signed)
Patient notified that PCP out of office until Thursday and she stated that she has 10 days worth and will wait until PCP comes back in to get #90 day supply.

## 2018-03-23 MED ORDER — LEVOTHYROXINE SODIUM 125 MCG PO TABS: 125.0000 ug | ORAL_TABLET | Freq: Every day | ORAL | 0 refills | Status: DC

## 2018-03-23 MED ORDER — AMLODIPINE BESYLATE 5 MG PO TABS: 5.0000 mg | ORAL_TABLET | Freq: Every day | ORAL | 0 refills | Status: DC

## 2018-03-23 NOTE — Telephone Encounter (Signed)
90 day supply sent to pharmacy per patient request.

## 2018-04-01 DIAGNOSIS — D225 Melanocytic nevi of trunk: Secondary | ICD-10-CM | POA: Diagnosis not present

## 2018-04-01 DIAGNOSIS — L821 Other seborrheic keratosis: Secondary | ICD-10-CM | POA: Diagnosis not present

## 2018-04-01 DIAGNOSIS — D1801 Hemangioma of skin and subcutaneous tissue: Secondary | ICD-10-CM | POA: Diagnosis not present

## 2018-04-01 DIAGNOSIS — D2371 Other benign neoplasm of skin of right lower limb, including hip: Secondary | ICD-10-CM | POA: Diagnosis not present

## 2018-04-19 DIAGNOSIS — Z01419 Encounter for gynecological examination (general) (routine) without abnormal findings: Secondary | ICD-10-CM | POA: Diagnosis not present

## 2018-04-19 DIAGNOSIS — Z6837 Body mass index (BMI) 37.0-37.9, adult: Secondary | ICD-10-CM | POA: Diagnosis not present

## 2018-05-02 DIAGNOSIS — M1712 Unilateral primary osteoarthritis, left knee: Secondary | ICD-10-CM | POA: Diagnosis not present

## 2018-05-04 DIAGNOSIS — Z1231 Encounter for screening mammogram for malignant neoplasm of breast: Secondary | ICD-10-CM | POA: Diagnosis not present

## 2018-05-04 LAB — HM MAMMOGRAPHY

## 2018-05-27 ENCOUNTER — Encounter: Payer: BLUE CROSS/BLUE SHIELD | Admitting: Family Medicine

## 2018-06-07 ENCOUNTER — Ambulatory Visit (INDEPENDENT_AMBULATORY_CARE_PROVIDER_SITE_OTHER): Payer: BLUE CROSS/BLUE SHIELD | Admitting: Family Medicine

## 2018-06-07 ENCOUNTER — Encounter: Payer: Self-pay | Admitting: Family Medicine

## 2018-06-07 VITALS — BP 139/74 | HR 57 | Temp 98.0°F | Resp 16 | Ht 64.5 in | Wt 218.1 lb

## 2018-06-07 DIAGNOSIS — I10 Essential (primary) hypertension: Secondary | ICD-10-CM | POA: Diagnosis not present

## 2018-06-07 DIAGNOSIS — E669 Obesity, unspecified: Secondary | ICD-10-CM | POA: Diagnosis not present

## 2018-06-07 DIAGNOSIS — Z9189 Other specified personal risk factors, not elsewhere classified: Secondary | ICD-10-CM | POA: Diagnosis not present

## 2018-06-07 DIAGNOSIS — S335XXA Sprain of ligaments of lumbar spine, initial encounter: Secondary | ICD-10-CM | POA: Insufficient documentation

## 2018-06-07 DIAGNOSIS — Z0000 Encounter for general adult medical examination without abnormal findings: Secondary | ICD-10-CM | POA: Diagnosis not present

## 2018-06-07 DIAGNOSIS — Z23 Encounter for immunization: Secondary | ICD-10-CM | POA: Diagnosis not present

## 2018-06-07 DIAGNOSIS — Z1159 Encounter for screening for other viral diseases: Secondary | ICD-10-CM | POA: Diagnosis not present

## 2018-06-07 LAB — CBC WITH DIFFERENTIAL/PLATELET
BASOS PCT: 1.3 % (ref 0.0–3.0)
Basophils Absolute: 0.1 10*3/uL (ref 0.0–0.1)
EOS PCT: 4.3 % (ref 0.0–5.0)
Eosinophils Absolute: 0.2 10*3/uL (ref 0.0–0.7)
HCT: 42.2 % (ref 36.0–46.0)
HEMOGLOBIN: 13.9 g/dL (ref 12.0–15.0)
LYMPHS PCT: 29.2 % (ref 12.0–46.0)
Lymphs Abs: 1.7 10*3/uL (ref 0.7–4.0)
MCHC: 32.8 g/dL (ref 30.0–36.0)
MCV: 89.8 fl (ref 78.0–100.0)
MONOS PCT: 7.2 % (ref 3.0–12.0)
Monocytes Absolute: 0.4 10*3/uL (ref 0.1–1.0)
NEUTROS ABS: 3.3 10*3/uL (ref 1.4–7.7)
Neutrophils Relative %: 58 % (ref 43.0–77.0)
PLATELETS: 211 10*3/uL (ref 150.0–400.0)
RBC: 4.7 Mil/uL (ref 3.87–5.11)
RDW: 14.4 % (ref 11.5–15.5)
WBC: 5.7 10*3/uL (ref 4.0–10.5)

## 2018-06-07 LAB — BASIC METABOLIC PANEL
BUN: 15 mg/dL (ref 6–23)
CALCIUM: 9.6 mg/dL (ref 8.4–10.5)
CO2: 32 meq/L (ref 19–32)
Chloride: 106 mEq/L (ref 96–112)
Creatinine, Ser: 0.78 mg/dL (ref 0.40–1.20)
GFR: 80.45 mL/min (ref >60.00)
Glucose, Bld: 102 mg/dL — ABNORMAL HIGH (ref 70–99)
Potassium: 4.8 mEq/L (ref 3.5–5.1)
SODIUM: 141 meq/L (ref 135–145)

## 2018-06-07 NOTE — Patient Instructions (Signed)

## 2018-06-07 NOTE — Progress Notes (Signed)
Office Note 06/07/2018  CC:  Chief Complaint  Patient presents with  . Annual Exam    Pt is fasting.     HPI:  Connie Thompson is a 57 y.o. White female who is here for annual health maintenance exam.   Home bp checks lately have been normal. Her stress level is improved since kids/grandkids were staying in her home lately.  Eyes and dental UTD. Exercise and diet: always working on getting better with this.  Past Medical History:  Diagnosis Date  . Allergic dermatitis due to poison vine   . Chronic renal insufficiency, stage 2 (mild) 02/2017   GFR 60s  . First metacarpal bone fracture 05/2017   base, extraarticular--thumb spica placed and referred pt to orthopedics.  . Genital warts   . History of adenomatous polyp of colon 2011   Repeat colonoscopy 2017 normal.  Recall 2022.  [name] History of blood transfusion 1984  . Hypertension   . Hypothyroidism   . IFG (impaired fasting glucose)    HbA1c 5.7% June 2019  . Osteoarthritis of both knees    L>R--hx of visco injections.   Steroid inj 12/07/17 at ortho.    Past Surgical History:  Procedure Laterality Date  . ABDOMINAL HYSTERECTOMY  1987  . COLONOSCOPY  age 17 and again in 2017   Polyps on initial screening colonoscopy, no polyps 2017.  Recall 2022.  [name] DILATION AND CURETTAGE OF UTERUS  1984   Molar Pregnancy  . OOPHORECTOMY Right 2012  . TONSILLECTOMY  1973  . TREATMENT FISTULA ANAL  2013  . TUBAL LIGATION  1986  . WISDOM TOOTH EXTRACTION  1975    Family History  Problem Relation Age of Onset  . Alcohol abuse Mother   . Stroke Mother   . Hypertension Mother   . Alcohol abuse Father   . Hypertension Father   . COPD Brother   . Lung cancer Maternal Uncle        smoker  . Alcohol abuse Maternal Uncle   . Stroke Maternal Grandmother   . Hypertension Maternal Grandmother   . Hypertension Brother     Social History   Socioeconomic History  . Marital status: Married    Spouse name: Not on file  . Number of  children: Not on file  . Years of education: Not on file  . Highest education level: Not on file  Occupational History  . Not on file  Social Needs  . Financial resource strain: Not on file  . Food insecurity:    Worry: Not on file    Inability: Not on file  . Transportation needs:    Medical: Not on file    Non-medical: Not on file  Tobacco Use  . Smoking status: Never Smoker  . Smokeless tobacco: Never Used  Substance and Sexual Activity  . Alcohol use: Yes    Alcohol/week: 7.0 standard drinks    Types: 7 Glasses of wine per week  . Drug use: No  . Sexual activity: Not on file  Lifestyle  . Physical activity:    Days per week: Not on file    Minutes per session: Not on file  . Stress: Not on file  Relationships  . Social connections:    Talks on phone: Not on file    Gets together: Not on file    Attends religious service: Not on file    Active member of club or organization: Not on file    Attends meetings of clubs or  organizations: Not on file    Relationship status: Not on file  . Intimate partner violence:    Fear of current or ex partner: Not on file    Emotionally abused: Not on file    Physically abused: Not on file    Forced sexual activity: Not on file  Other Topics Concern  . Not on file  Social History Narrative   Married, 2 grown children, 2 GC.   Moved to [place] area 12/2016.  Pt originally from [place].   Educ: 2 yr college.   Occup: homemaker   No tobacco.   Occ alcohol.    Outpatient Medications Prior to Visit  Medication Sig Dispense Refill  . albuterol (VENTOLIN HFA) 108 (90 Base) MCG/ACT inhaler Inhale 2 puffs into the lungs every 6 (six) hours as needed for wheezing or shortness of breath. 1 Inhaler 0  . amLODipine (NORVASC) 5 MG tablet Take 1 tablet (5 mg total) by mouth daily. 90 tablet 0  . ibuprofen (ADVIL) 200 MG tablet Take 200 mg by mouth every 6 (six) hours as needed.    . Ibuprofen-Famotidine (DUEXIS) 800-26.6 MG TABS Take 1 tablet by  mouth daily.    [name] levothyroxine (SYNTHROID, LEVOTHROID) 125 MCG tablet Take 1 tablet (125 mcg total) by mouth daily. 90 tablet 0  . amLODipine (NORVASC) 5 MG tablet TAKE 1 TABLET BY MOUTH DAILY. *NEEDS APPT FOR FURTHER REFILLS* (Patient not taking: Reported on 06/07/2018) 30 tablet 0  . HYDROcodone-homatropine (HYCODAN) 5-1.5 MG/5ML syrup 1-2 tsp po qhs prn cough (Patient not taking: Reported on 06/07/2018) 120 mL 0  . predniSONE (DELTASONE) 20 MG tablet 2 tabs po qd x 5d, then 1 tab po qd x 5d (Patient not taking: Reported on 06/07/2018) 15 tablet 0   No facility-administered medications prior to visit.     Allergies  Allergen Reactions  . Sulfa Antibiotics Hives    ROS Review of Systems  Constitutional: Negative for appetite change, chills, fatigue and fever.  HENT: Negative for congestion, dental problem, ear pain and sore throat.   Eyes: Negative for discharge, redness and visual disturbance.  Respiratory: Negative for cough, chest tightness, shortness of breath and wheezing.   Cardiovascular: Negative for chest pain, palpitations and leg swelling.  Gastrointestinal: Negative for abdominal pain, blood in stool, diarrhea, nausea and vomiting.  Genitourinary: Negative for difficulty urinating, dysuria, flank pain, frequency, hematuria and urgency.  Musculoskeletal: Negative for arthralgias, back pain, joint swelling, myalgias and neck stiffness.  Skin: Negative for pallor and rash.  Neurological: Negative for dizziness, speech difficulty, weakness and headaches.  Hematological: Negative for adenopathy. Does not bruise/bleed easily.  Psychiatric/Behavioral: Negative for confusion and sleep disturbance. The patient is not nervous/anxious.     PE; Blood pressure 139/74, pulse (!) 57, temperature 98 F (36.7 C), temperature source Oral, resp. rate 16, height 5' 4.5" (1.638 m), weight 218 lb 2 oz (98.9 kg), SpO2 95 %. Body mass index is 36.86 kg/m. Pt examined with Pryor Ochoa,  CMA, as chaperone.  Gen: Alert, well appearing.  Patient is oriented to person, place, time, and situation. AFFECT: pleasant, lucid thought and speech. ENT: Ears: EACs clear, normal epithelium.  TMs with good light reflex and landmarks bilaterally.  Eyes: no injection, icteris, swelling, or exudate.  EOMI, PERRLA. Nose: no drainage or turbinate edema/swelling.  No injection or focal lesion.  Mouth: lips without lesion/swelling.  Oral mucosa pink and moist.  Dentition intact and without obvious caries or gingival swelling.  Oropharynx without erythema, exudate,  or swelling.  Neck: supple/nontender.  No LAD, mass, or TM.  Carotid pulses 2+ bilaterally, without bruits. CV: RRR, no m/r/g.   LUNGS: CTA bilat, nonlabored resps, good aeration in all lung fields. ABD: soft, NT, ND, BS normal.  No hepatospenomegaly or mass.  No bruits. EXT: no clubbing, cyanosis, or edema.  Musculoskeletal: no joint swelling, erythema, warmth, or tenderness.  ROM of all joints intact. Skin - no sores or suspicious lesions or rashes or color changes   Pertinent labs:  Lab Results  Component Value Date   TSH 0.78 03/02/2018   No results found for: WBC, HGB, HCT, MCV, PLT Lab Results  Component Value Date   CREATININE 0.78 03/02/2018   BUN 11 03/02/2018   NA 143 03/02/2018   K 4.3 03/02/2018   CL 107 03/02/2018   CO2 30 03/02/2018   Lab Results  Component Value Date   ALT 15 03/02/2018   AST 14 03/02/2018   ALKPHOS 61 03/02/2018   BILITOT 0.4 03/02/2018   Lab Results  Component Value Date   CHOL 172 03/02/2018   Lab Results  Component Value Date   HDL 53.20 03/02/2018   Lab Results  Component Value Date   LDLCALC 99 03/02/2018   Lab Results  Component Value Date   TRIG 102.0 03/02/2018   Lab Results  Component Value Date   CHOLHDL 3 03/02/2018   Lab Results  Component Value Date   HGBA1C 5.7 03/03/2018    ASSESSMENT AND PLAN:   Health maintenance exam: Reviewed age and gender  appropriate health maintenance issues (prudent diet, regular exercise, health risks of tobacco and excessive alcohol, use of seatbelts, fire alarms in home, use of sunscreen).  Also reviewed age and gender appropriate health screening as well as vaccine recommendations. Vaccines: Flu vaccine-->given today.  Shingrix discussed---she declines for now. Labs: recent labs 02/2018 normal, but will obtain CBC w/diff and repeat BMET today.  Hep C screening today as well. Cervical ca screening: has GYN, got pap last month and this is likely the last one (hx of hysterectomy for noncancerous dx). Breast ca screening:  Mammogram normal 1 mo ago (via her GYN). Colon ca screening: hx of adenomatous polyps:  next colonoscopy due 2022.  An After Visit Summary was printed and given to the patient.  FOLLOW UP:  Return in about 6 months (around 12/06/2018) for routine chronic illness f/u.  Signed:  Santiago BumpersPhil Tarin Johndrow, MD           06/07/2018

## 2018-06-08 ENCOUNTER — Encounter: Payer: Self-pay | Admitting: Family Medicine

## 2018-06-08 LAB — HEPATITIS C ANTIBODY
Hepatitis C Ab: NONREACTIVE
SIGNAL TO CUT-OFF: 0.01 (ref <1.00)

## 2018-06-21 ENCOUNTER — Other Ambulatory Visit: Payer: Self-pay | Admitting: Family Medicine

## 2018-06-21 MED ORDER — AMLODIPINE BESYLATE 5 MG PO TABS: 5.0000 mg | ORAL_TABLET | Freq: Every day | ORAL | 1 refills | Status: DC

## 2018-07-08 ENCOUNTER — Ambulatory Visit (INDEPENDENT_AMBULATORY_CARE_PROVIDER_SITE_OTHER): Payer: BLUE CROSS/BLUE SHIELD | Admitting: Family Medicine

## 2018-07-08 ENCOUNTER — Encounter: Payer: Self-pay | Admitting: Family Medicine

## 2018-07-08 VITALS — BP 124/74 | HR 65 | Temp 98.1°F | Resp 16 | Ht 64.5 in | Wt 222.5 lb

## 2018-07-08 DIAGNOSIS — N3000 Acute cystitis without hematuria: Secondary | ICD-10-CM | POA: Diagnosis not present

## 2018-07-08 LAB — POCT URINALYSIS DIPSTICK
Blood, UA: NEGATIVE
Glucose, UA: NEGATIVE
Ketones, UA: NEGATIVE
NITRITE UA: NEGATIVE
PH UA: 6 (ref 5.0–8.0)
PROTEIN UA: POSITIVE — AB
Spec Grav, UA: 1.025 (ref 1.010–1.025)
UROBILINOGEN UA: 0.2 U/dL

## 2018-07-08 MED ORDER — CIPROFLOXACIN HCL 500 MG PO TABS: 500.0000 mg | ORAL_TABLET | Freq: Two times a day (BID) | ORAL | 0 refills | Status: AC

## 2018-07-08 NOTE — Progress Notes (Signed)
OFFICE VISIT  07/08/2018   CC:  Chief Complaint  Patient presents with  . Urinary Tract Infection   HPI:    Patient is a 57 y.o.  female who presents for 5 days of intermittent malaise, dysuria, headache last several days.   +Urinary frequency, urinary urgency.  Mild itching in urethral area.  Mild stomach upset but no nausea or abd pain or flank pain.  Past Medical History:  Diagnosis Date  . Allergic dermatitis due to poison vine   . Chronic renal insufficiency, stage 2 (mild) 02/2017   GFR 60s  . First metacarpal bone fracture 05/2017   base, extraarticular--thumb spica placed and referred pt to orthopedics.  . Genital warts   . History of adenomatous polyp of colon 2011   Repeat colonoscopy 2017 normal.  Recall 2022.  [name] History of blood transfusion 1984  . Hypertension   . Hypothyroidism   . IFG (impaired fasting glucose)    HbA1c 5.7% June 2019  . Osteoarthritis of both knees    L>R--hx of visco injections.   Steroid inj 12/07/17 at ortho.    Past Surgical History:  Procedure Laterality Date  . ABDOMINAL HYSTERECTOMY  1987  . COLONOSCOPY  age 84 and again in 2017   Polyps on initial screening colonoscopy, no polyps 2017.  Recall 2022.  [name] DILATION AND CURETTAGE OF UTERUS  1984   Molar Pregnancy  . OOPHORECTOMY Right 2012  . TONSILLECTOMY  1973  . TREATMENT FISTULA ANAL  2013  . TUBAL LIGATION  1986  . WISDOM TOOTH EXTRACTION  1975    Outpatient Medications Prior to Visit  Medication Sig Dispense Refill  . amLODipine (NORVASC) 5 MG tablet Take 1 tablet (5 mg total) by mouth daily. 90 tablet 1  . Ibuprofen-Famotidine (DUEXIS) 800-26.6 MG TABS Take 1 tablet by mouth daily.    [name] levothyroxine (SYNTHROID, LEVOTHROID) 125 MCG tablet TAKE ONE TABLET BY MOUTH EVERY DAY *NEED APPT FOR MORE REFILLS* 90 tablet 1  . albuterol (VENTOLIN HFA) 108 (90 Base) MCG/ACT inhaler Inhale 2 puffs into the lungs every 6 (six) hours as needed for wheezing or shortness of breath.  (Patient not taking: Reported on 07/08/2018) 1 Inhaler 0  . amLODipine (NORVASC) 5 MG tablet TAKE 1 TABLET BY MOUTH DAILY. *NEEDS APPT FOR FURTHER REFILLS* (Patient not taking: Reported on 06/07/2018) 30 tablet 0  . HYDROcodone-homatropine (HYCODAN) 5-1.5 MG/5ML syrup 1-2 tsp po qhs prn cough (Patient not taking: Reported on 06/07/2018) 120 mL 0  . ibuprofen (ADVIL) 200 MG tablet Take 200 mg by mouth every 6 (six) hours as needed.    . predniSONE (DELTASONE) 20 MG tablet 2 tabs po qd x 5d, then 1 tab po qd x 5d (Patient not taking: Reported on 06/07/2018) 15 tablet 0   No facility-administered medications prior to visit.     Allergies  Allergen Reactions  . Sulfa Antibiotics Hives    ROS As per HPI  PE: Blood pressure 124/74, pulse 65, temperature 98.1 F (36.7 C), temperature source Oral, resp. rate 16, height 5' 4.5" (1.638 m), weight 222 lb 8 oz (100.9 kg), SpO2 96 %. Gen: Alert, well appearing.  Patient is oriented to person, place, time, and situation. AFFECT: pleasant, lucid thought and speech. CV: RRR, no m/r/g.   LUNGS: CTA bilat, nonlabored resps, good aeration in all lung fields. No CVA or flank tenderness.  LABS:    Chemistry      Component Value Date/Time   NA 141 06/07/2018 1030  K 4.8 06/07/2018 1030   CL 106 06/07/2018 1030   CO2 32 06/07/2018 1030   BUN 15 06/07/2018 1030   CREATININE 0.78 06/07/2018 1030      Component Value Date/Time   CALCIUM 9.6 06/07/2018 1030   ALKPHOS 61 03/02/2018 0922   AST 14 03/02/2018 0922   ALT 15 03/02/2018 0922   BILITOT 0.4 03/02/2018 0922     Lab Results  Component Value Date   HGBA1C 5.7 03/03/2018   Lab Results  Component Value Date   TSH 0.78 03/02/2018   CC UA today: 4+ leukocytes, small protein, SG 1.025, otherwise normal.  IMPRESSION AND PLAN:  Acute UTI. Sent urine for c/s Cipro 500 mg bid x 5d rx'd.  Take 3d of abx.  If any residual sx's after that time, then take the two remaining days of abx.  An  After Visit Summary was printed and given to the patient.  FOLLOW UP: Return if symptoms worsen or fail to improve.  Signed:  Santiago Bumpers, MD           07/08/2018

## 2018-07-10 LAB — URINE CULTURE
MICRO NUMBER: 91286925
SPECIMEN QUALITY:: ADEQUATE

## 2018-08-31 ENCOUNTER — Ambulatory Visit (INDEPENDENT_AMBULATORY_CARE_PROVIDER_SITE_OTHER): Payer: BLUE CROSS/BLUE SHIELD | Admitting: Family Medicine

## 2018-08-31 ENCOUNTER — Encounter: Payer: Self-pay | Admitting: Family Medicine

## 2018-08-31 VITALS — BP 149/74 | HR 62 | Temp 97.8°F | Resp 16 | Ht 64.5 in | Wt 220.4 lb

## 2018-08-31 DIAGNOSIS — J069 Acute upper respiratory infection, unspecified: Secondary | ICD-10-CM | POA: Diagnosis not present

## 2018-08-31 MED ORDER — AMOXICILLIN 875 MG PO TABS: 875.0000 mg | ORAL_TABLET | Freq: Two times a day (BID) | ORAL | 0 refills | Status: DC

## 2018-08-31 NOTE — Progress Notes (Signed)
OFFICE VISIT  08/31/2018   CC:  Chief Complaint  Patient presents with  . URI   HPI:    Patient is a 57 y.o.  female who presents for respiratory symptoms. Onset 2.5 weeks ago, HA and ST and some nasal congestion.  +Fatigue. Severe HA and fatigue kept her in bed for most of a week.  Severe congestion in nose/sinus/ears. No signif cough and she can tell this is from the PND.  This morning she can breath through nose for first time in 2 wks! Lots of otc meds tried.  No fevers.  ST is gone.  Mild HA now.  Last week had facial pain but none since then---thick, sticky mucous.   Eating and drinking ok.  No n/v/d.  Past Medical History:  Diagnosis Date  . Allergic dermatitis due to poison vine   . Chronic renal insufficiency, stage 2 (mild) 02/2017   GFR 60s  . First metacarpal bone fracture 05/2017   base, extraarticular--thumb spica placed and referred pt to orthopedics.  . Genital warts   . History of adenomatous polyp of colon 2011   Repeat colonoscopy 2017 normal.  Recall 2022.  [name]. History of blood transfusion 1984  . Hypertension   . Hypothyroidism   . IFG (impaired fasting glucose)    HbA1c 5.7% June 2019  . Osteoarthritis of both knees    L>R--hx of visco injections.   Steroid inj 12/07/17 at ortho.    Past Surgical History:  Procedure Laterality Date  . ABDOMINAL HYSTERECTOMY  1987  . COLONOSCOPY  age [age] and again in 2017   Polyps on initial screening colonoscopy, no polyps 2017.  Recall 2022.  [name]. DILATION AND CURETTAGE OF UTERUS  1984   Molar Pregnancy  . OOPHORECTOMY Right 2012  . TONSILLECTOMY  1973  . TREATMENT FISTULA ANAL  2013  . TUBAL LIGATION  1986  . WISDOM TOOTH EXTRACTION  1975    Outpatient Medications Prior to Visit  Medication Sig Dispense Refill  . amLODipine (NORVASC) 5 MG tablet Take 1 tablet (5 mg total) by mouth daily. 90 tablet 1  . Ibuprofen-Famotidine (DUEXIS) 800-26.6 MG TABS Take 1 tablet by mouth daily.    [name]. levothyroxine (SYNTHROID,  LEVOTHROID) 125 MCG tablet TAKE ONE TABLET BY MOUTH EVERY DAY *NEED APPT FOR MORE REFILLS* 90 tablet 1   No facility-administered medications prior to visit.     Allergies  Allergen Reactions  . Sulfa Antibiotics Hives    ROS As per HPI  PE: Blood pressure (!) 149/74, pulse 62, temperature 97.8 F (36.6 C), temperature source Oral, resp. rate 16, height 5' 4.5" (1.638 m), weight 220 lb 6 oz (100 kg), SpO2 97 %. VS: noted--normal. Gen: alert, NAD, NONTOXIC APPEARING. HEENT: eyes without injection, drainage, or swelling.  Ears: EACs clear, TMs with normal light reflex and landmarks.  Nose: Clear rhinorrhea, with some dried, crusty exudate adherent to mildly injected mucosa.  No purulent d/c.  No paranasal sinus TTP.  No facial swelling.  Throat and mouth without focal lesion.  No pharyngial swelling, erythema, or exudate.   Neck: supple, no LAD.   LUNGS: CTA bilat, nonlabored resps.   CV: RRR, no m/r/g. EXT: no c/c/e SKIN: no rash  LABS:    Chemistry      Component Value Date/Time   NA 141 06/07/2018 1030   K 4.8 06/07/2018 1030   CL 106 06/07/2018 1030   CO2 32 06/07/2018 1030   BUN 15 06/07/2018 1030   CREATININE  0.78 06/07/2018 1030      Component Value Date/Time   CALCIUM 9.6 06/07/2018 1030   ALKPHOS 61 03/02/2018 0922   AST 14 03/02/2018 0922   ALT 15 03/02/2018 0922   BILITOT 0.4 03/02/2018 0922      IMPRESSION AND PLAN:  Prolonged URI: seems to have turned the corner the last 24h. Discussed ongoing symptomatic care, fluids, gradual return to normal activity. I did give amoxil 875mg  bid x 10d--PRINTED RX--for her to hold and ONLY fill if sx's returned significantly in the next 1-3 days.  She expressed understanding and agrees with the plan.  An After Visit Summary was printed and given to the patient.  FOLLOW UP: Return if symptoms worsen or fail to improve.  Signed:  Santiago Bumpers, MD           08/31/2018

## 2018-09-02 DIAGNOSIS — M1712 Unilateral primary osteoarthritis, left knee: Secondary | ICD-10-CM | POA: Diagnosis not present

## 2018-09-07 ENCOUNTER — Encounter: Payer: Self-pay | Admitting: Family Medicine

## 2018-09-08 DIAGNOSIS — M25511 Pain in right shoulder: Secondary | ICD-10-CM | POA: Insufficient documentation

## 2018-09-10 DIAGNOSIS — H04123 Dry eye syndrome of bilateral lacrimal glands: Secondary | ICD-10-CM | POA: Diagnosis not present

## 2018-09-20 DIAGNOSIS — M25511 Pain in right shoulder: Secondary | ICD-10-CM | POA: Diagnosis not present

## 2018-09-20 DIAGNOSIS — M19011 Primary osteoarthritis, right shoulder: Secondary | ICD-10-CM | POA: Diagnosis not present

## 2018-09-20 DIAGNOSIS — S43101A Unspecified dislocation of right acromioclavicular joint, initial encounter: Secondary | ICD-10-CM | POA: Diagnosis not present

## 2018-09-26 ENCOUNTER — Other Ambulatory Visit: Payer: Self-pay | Admitting: Family Medicine

## 2018-09-28 ENCOUNTER — Encounter: Payer: Self-pay | Admitting: Family Medicine

## 2018-10-13 DIAGNOSIS — H5213 Myopia, bilateral: Secondary | ICD-10-CM | POA: Diagnosis not present

## 2018-10-13 DIAGNOSIS — H01123 Discoid lupus erythematosus of right eye, unspecified eyelid: Secondary | ICD-10-CM | POA: Diagnosis not present

## 2018-11-02 ENCOUNTER — Encounter: Payer: Self-pay | Admitting: Family Medicine

## 2018-11-02 ENCOUNTER — Ambulatory Visit (INDEPENDENT_AMBULATORY_CARE_PROVIDER_SITE_OTHER): Payer: BLUE CROSS/BLUE SHIELD | Admitting: Family Medicine

## 2018-11-02 VITALS — BP 138/78 | HR 58 | Temp 98.3°F | Resp 16 | Ht 64.5 in | Wt 219.2 lb

## 2018-11-02 DIAGNOSIS — B349 Viral infection, unspecified: Secondary | ICD-10-CM | POA: Diagnosis not present

## 2018-11-02 NOTE — Progress Notes (Signed)
OFFICE VISIT  11/13/2018   CC:  Chief Complaint  Patient presents with  . URI    HA, cough, sore throat   HPI:    Patient is a 58 y.o. Caucasian female who presents for cough, ST, HA. Trip to [place] on [employer], returned 10/21/2018.  Onset of scratchy throat and HA the day she was driving home.  Itchy rash on back of both upper arms came and went.  Nasal congestion, clear PND, cough, sneezing, fatigue.  Malaise significant over last week, very fatigued, chest tightness coming and going, wheezing. No fever.  ST is gone now. Mucinex, benadryl, nyquil.  Stopped these meds a few days ago and sx's are no worse compared to on the meds.  No contact with anyone with similar sx's and no contact with anyone that has recently traveled to [place] "that she knows of".   Past Medical History:  Diagnosis Date  . AC separation    Right, grade 3: Dx'd 08/2018  . Allergic dermatitis due to poison vine   . Chronic renal insufficiency, stage 2 (mild) 02/2017   GFR 60s  . First metacarpal bone fracture 05/2017   base, extraarticular--thumb spica placed and referred pt to orthopedics.  . Genital warts   . History of adenomatous polyp of colon 2011   Repeat colonoscopy 2017 normal.  Recall 2022.  [name] History of blood transfusion 1984  . Hypertension   . Hypothyroidism   . IFG (impaired fasting glucose)    HbA1c 5.7% June 2019  . Osteoarthritis of both knees    L>R--hx of visco injections.   Multiple steroid inj via ortho.    Past Surgical History:  Procedure Laterality Date  . ABDOMINAL HYSTERECTOMY  1987  . COLONOSCOPY  age 87 and again in 2017   Polyps on initial screening colonoscopy, no polyps 2017.  Recall 2022.  [name] DILATION AND CURETTAGE OF UTERUS  1984   Molar Pregnancy  . OOPHORECTOMY Right 2012  . TONSILLECTOMY  1973  . TREATMENT FISTULA ANAL  2013  . TUBAL LIGATION  1986  . WISDOM TOOTH EXTRACTION  1975    Outpatient Medications Prior to Visit  Medication Sig Dispense Refill  .  amLODipine (NORVASC) 5 MG tablet Take 1 tablet (5 mg total) by mouth daily. 90 tablet 1  . Ibuprofen-Famotidine (DUEXIS) 800-26.6 MG TABS Take 1 tablet by mouth daily.    [name] levothyroxine (SYNTHROID, LEVOTHROID) 125 MCG tablet TAKE ONE TABLET BY MOUTH EVERY DAY *NEED APPT FOR MORE REFILLS* 90 tablet 1  . amoxicillin (AMOXIL) 875 MG tablet Take 1 tablet (875 mg total) by mouth 2 (two) times daily. 20 tablet 0   No facility-administered medications prior to visit.     Allergies  Allergen Reactions  . Sulfa Antibiotics Hives    ROS As per HPI  PE: Blood pressure 138/78, pulse (!) 58, temperature 98.3 F (36.8 C), temperature source Oral, resp. rate 16, height 5' 4.5" (1.638 m), weight 219 lb 4 oz (99.5 kg), SpO2 99 %. Gen: Alert, well appearing.  Patient is oriented to person, place, time, and situation. AFFECT: pleasant, lucid thought and speech. ENT: Ears: EACs clear, normal epithelium.  TMs with good light reflex and landmarks bilaterally.  Eyes: no injection, icteris, swelling, or exudate.  EOMI, PERRLA. Nose: no drainage or turbinate edema/swelling.  No injection or focal lesion.  Mouth: lips without lesion/swelling.  Oral mucosa pink and moist.  Dentition intact and without obvious caries or gingival swelling.  Oropharynx without erythema,  exudate, or swelling.  Neck - No masses or thyromegaly or limitation in range of motion CV: RRR, no m/r/g.   LUNGS: CTA bilat, nonlabored resps, good aeration in all lung fields. EXT: no clubbing or cyanosis.  no edema.    LABS:    Chemistry      Component Value Date/Time   NA 141 06/07/2018 1030   K 4.8 06/07/2018 1030   CL 106 06/07/2018 1030   CO2 32 06/07/2018 1030   BUN 15 06/07/2018 1030   CREATININE 0.78 06/07/2018 1030      Component Value Date/Time   CALCIUM 9.6 06/07/2018 1030   ALKPHOS 61 03/02/2018 0922   AST 14 03/02/2018 0922   ALT 15 03/02/2018 0922   BILITOT 0.4 03/02/2018 0922     Lab Results  Component Value Date    WBC 5.7 06/07/2018   HGB 13.9 06/07/2018   HCT 42.2 06/07/2018   MCV 89.8 06/07/2018   PLT 211.0 06/07/2018    IMPRESSION AND PLAN:  Viral resp syndrome. Slow to resolve.  Discussed with pt, decided on no w/u at this time. No rx meds today.  OTC symptomatic care discussed.  An After Visit Summary was printed and given to the patient.  FOLLOW UP: Return if symptoms worsen or fail to improve.  Signed:  Santiago Bumpers, MD           11/13/2018

## 2018-11-23 ENCOUNTER — Encounter: Payer: Self-pay | Admitting: Family Medicine

## 2018-11-23 ENCOUNTER — Ambulatory Visit (INDEPENDENT_AMBULATORY_CARE_PROVIDER_SITE_OTHER): Payer: BLUE CROSS/BLUE SHIELD | Admitting: Family Medicine

## 2018-11-23 ENCOUNTER — Other Ambulatory Visit: Payer: Self-pay

## 2018-11-23 VITALS — BP 129/71 | HR 71 | Temp 97.9°F | Resp 16 | Ht 64.5 in | Wt 219.0 lb

## 2018-11-23 DIAGNOSIS — I10 Essential (primary) hypertension: Secondary | ICD-10-CM

## 2018-11-23 DIAGNOSIS — J0100 Acute maxillary sinusitis, unspecified: Secondary | ICD-10-CM

## 2018-11-23 DIAGNOSIS — E039 Hypothyroidism, unspecified: Secondary | ICD-10-CM | POA: Diagnosis not present

## 2018-11-23 DIAGNOSIS — J180 Bronchopneumonia, unspecified organism: Secondary | ICD-10-CM

## 2018-11-23 MED ORDER — BENZONATATE 200 MG PO CAPS: 200.0000 mg | ORAL_CAPSULE | Freq: Three times a day (TID) | ORAL | 1 refills | Status: DC | PRN

## 2018-11-23 MED ORDER — PREDNISONE 20 MG PO TABS: ORAL_TABLET | ORAL | 0 refills | Status: DC

## 2018-11-23 MED ORDER — AMOXICILLIN 500 MG PO CAPS: ORAL_CAPSULE | ORAL | 0 refills | Status: DC

## 2018-11-23 NOTE — Progress Notes (Signed)
OFFICE VISIT  11/23/2018   CC:  Chief Complaint  Patient presents with  . Cough    productive, green mucus x1 week    HPI:    Patient is a 58 y.o. Caucasian female who presents for cough. Has had nasal congestion, runny nose, PND, coughing, green mucous the last 6 d. Feels pain/pressure in R maxillary sinus region.  +HA.  NO fever. Feeling malaise. Some diffuse body aches, fatigue.  +ST. Tongue is sore, white patches noted.  No wheezing, chest tightness, SOB.  Of note, this is f/u for hypothyroidism and HTN as well.  HTN: home bp's normal consistently.  HYPOTH; takes med daily on empty stomach w/out any other meds with it.  ROS: no CP, no SOB, no wheezing, no HAs, no rashes, no melena/hematochezia.  No polyuria or polydipsia.   Past Medical History:  Diagnosis Date  . AC separation    Right, grade 3: Dx'd 08/2018  . Allergic dermatitis due to poison vine   . Chronic renal insufficiency, stage 2 (mild) 02/2017   GFR 60s  . First metacarpal bone fracture 05/2017   base, extraarticular--thumb spica placed and referred pt to orthopedics.  . Genital warts   . History of adenomatous polyp of colon 2011   Repeat colonoscopy 2017 normal.  Recall 2022.  [name] History of blood transfusion 1984  . Hypertension   . Hypothyroidism   . IFG (impaired fasting glucose)    HbA1c 5.7% June 2019  . Osteoarthritis of both knees    L>R--hx of visco injections.   Multiple steroid inj via ortho.    Past Surgical History:  Procedure Laterality Date  . ABDOMINAL HYSTERECTOMY  1987  . COLONOSCOPY  age 14 and again in 2017   Polyps on initial screening colonoscopy, no polyps 2017.  Recall 2022.  [name] DILATION AND CURETTAGE OF UTERUS  1984   Molar Pregnancy  . OOPHORECTOMY Right 2012  . TONSILLECTOMY  1973  . TREATMENT FISTULA ANAL  2013  . TUBAL LIGATION  1986  . WISDOM TOOTH EXTRACTION  1975    Outpatient Medications Prior to Visit  Medication Sig Dispense Refill  . amLODipine (NORVASC)  5 MG tablet Take 1 tablet (5 mg total) by mouth daily. 90 tablet 1  . Ibuprofen-Famotidine (DUEXIS) 800-26.6 MG TABS Take 1 tablet by mouth daily.    [name] levothyroxine (SYNTHROID, LEVOTHROID) 125 MCG tablet TAKE ONE TABLET BY MOUTH EVERY DAY *NEED APPT FOR MORE REFILLS* 90 tablet 1   No facility-administered medications prior to visit.     Allergies  Allergen Reactions  . Sulfa Antibiotics Hives    ROS As per HPI  PE: Blood pressure 129/71, pulse 71, temperature 97.9 F (36.6 C), temperature source Oral, resp. rate 16, height 5' 4.5" (1.638 m), weight 219 lb (99.3 kg), SpO2 97 %. VS: noted--normal. Gen: alert, NAD, NONTOXIC APPEARING. HEENT: eyes without injection, drainage, or swelling.  Ears: EACs clear, TMs with normal light reflex and landmarks.  Nose: Clear rhinorrhea, with some dried, crusty exudate adherent to mildly injected mucosa.  No purulent d/c.  L>R paranasal sinus TTP.  No facial swelling.  Throat and mouth without focal lesion.  No pharyngial swelling, erythema, or exudate.   Neck: supple, no LAD.   LUNGS: CTA bilat, nonlabored resps.   CV: RRR, no m/r/g. EXT: no c/c/e SKIN: no rash    LABS:  Lab Results  Component Value Date   TSH 0.78 03/02/2018    Lab Results  Component Value Date  WBC 5.7 06/07/2018   HGB 13.9 06/07/2018   HCT 42.2 06/07/2018   MCV 89.8 06/07/2018   PLT 211.0 06/07/2018     Chemistry      Component Value Date/Time   NA 141 06/07/2018 1030   K 4.8 06/07/2018 1030   CL 106 06/07/2018 1030   CO2 32 06/07/2018 1030   BUN 15 06/07/2018 1030   CREATININE 0.78 06/07/2018 1030      Component Value Date/Time   CALCIUM 9.6 06/07/2018 1030   ALKPHOS 61 03/02/2018 0922   AST 14 03/02/2018 0922   ALT 15 03/02/2018 0922   BILITOT 0.4 03/02/2018 0922     Lab Results  Component Value Date   CHOL 172 03/02/2018   HDL 53.20 03/02/2018   LDLCALC 99 03/02/2018   TRIG 102.0 03/02/2018   CHOLHDL 3 03/02/2018   Lab Results  Component  Value Date   HGBA1C 5.7 03/03/2018    IMPRESSION AND PLAN:  1) Acute bronchopneumonia and acute maxillary sinusitis: Amoxil 1000mg  tid x 10d.  2) Hypothyroidism: The current medical regimen is effective;  continue present plan and medications. TSH normal 02/2018.  3) HTN: The current medical regimen is effective;  continue present plan and medications. Lytes/cr good 05/2018.  An After Visit Summary was printed and given to the patient.  FOLLOW UP: Return in about 6 months (around 05/26/2019) for annual CPE (fasting).Labs at that time.  Signed:  Santiago Bumpers, MD           11/23/2018

## 2018-11-23 NOTE — Patient Instructions (Signed)
Use otc generic saline nasal spray 2-3 times per day to irrigate/moisturize your nasal passages.  Voice rest x 3 days!

## 2018-11-28 ENCOUNTER — Other Ambulatory Visit: Payer: Self-pay | Admitting: Family Medicine

## 2018-12-06 ENCOUNTER — Ambulatory Visit: Payer: BLUE CROSS/BLUE SHIELD | Admitting: Family Medicine

## 2019-01-12 DIAGNOSIS — M13841 Other specified arthritis, right hand: Secondary | ICD-10-CM | POA: Diagnosis not present

## 2019-01-12 DIAGNOSIS — M79644 Pain in right finger(s): Secondary | ICD-10-CM | POA: Diagnosis not present

## 2019-01-12 DIAGNOSIS — M19049 Primary osteoarthritis, unspecified hand: Secondary | ICD-10-CM | POA: Diagnosis not present

## 2019-01-13 ENCOUNTER — Encounter: Payer: Self-pay | Admitting: Family Medicine

## 2019-02-28 ENCOUNTER — Encounter: Payer: Self-pay | Admitting: Family Medicine

## 2019-02-28 ENCOUNTER — Ambulatory Visit (INDEPENDENT_AMBULATORY_CARE_PROVIDER_SITE_OTHER): Payer: No Typology Code available for payment source

## 2019-02-28 ENCOUNTER — Other Ambulatory Visit: Payer: Self-pay

## 2019-02-28 ENCOUNTER — Ambulatory Visit (INDEPENDENT_AMBULATORY_CARE_PROVIDER_SITE_OTHER): Payer: No Typology Code available for payment source | Admitting: Family Medicine

## 2019-02-28 VITALS — Temp 97.9°F | Ht 64.5 in | Wt 224.0 lb

## 2019-02-28 DIAGNOSIS — S90425A Blister (nonthermal), left lesser toe(s), initial encounter: Secondary | ICD-10-CM

## 2019-02-28 DIAGNOSIS — R609 Edema, unspecified: Secondary | ICD-10-CM

## 2019-02-28 DIAGNOSIS — M79672 Pain in left foot: Secondary | ICD-10-CM

## 2019-02-28 DIAGNOSIS — L299 Pruritus, unspecified: Secondary | ICD-10-CM

## 2019-02-28 MED ORDER — METHYLPREDNISOLONE ACETATE 80 MG/ML IJ SUSP
80.0000 mg | Freq: Once | INTRAMUSCULAR | Status: AC
  Administered 2019-02-28: 15:00:00 80 mg via INTRAMUSCULAR

## 2019-02-28 MED ORDER — DOXYCYCLINE HYCLATE 100 MG PO TABS: 100.0000 mg | ORAL_TABLET | Freq: Two times a day (BID) | ORAL | 0 refills | Status: DC

## 2019-02-28 NOTE — Progress Notes (Signed)
VIRTUAL VISIT VIA VIDEO  I connected with Connie Thompson on 02/28/19 at  1:30 PM EDT by a video enabled telemedicine application and verified that I am speaking with the correct person using two identifiers. Location patient: Home Location provider: [facility] Persons participating in the virtual visit: Patient, Dr. Claiborne BillingsKuneff and R.Baker, LPN  I discussed the limitations of evaluation and management by telemedicine and the availability of in person appointments. The patient expressed understanding and agreed to proceed.   SUBJECTIVE Chief Complaint  Patient presents with  . Toe Pain    Pt states it started as athletes foot, itching and redness. She has had this in the past and turns to Cellulitis. This time it is blistering, swelling, painful, and itching. Denies getting bitten by anything. Denies fever.     HPI: Connie Thompson is a 58 y.o. female present today for foot pain and swelling of 3 days.  Reports she has noticed swelling of her forefoot and in between her second and third toes she has a blister formation.  There is some mild erythema associated as well.  She denies any fever, chills, nausea or vomit.  She denies any known injury that she is aware of.  She does not recall any insect exposure.  She was at the pool on Thursday and she worked in her yard on Saturday.  She has had a history of athlete's foot in the past, however this is not similar to those infections.  She does endorse the area is quite itchy.  ROS: See pertinent positives and negatives per HPI.  Patient Active Problem List   Diagnosis Date Noted  . Lumbar sprain 06/07/2018  . Osteoarthritis of left knee 09/17/2015    Social History   Tobacco Use  . Smoking status: Never Smoker  . Smokeless tobacco: Never Used  Substance Use Topics  . Alcohol use: Yes    Alcohol/week: 7.0 standard drinks    Types: 7 Glasses of wine per week    Current Outpatient Medications:  .  amLODipine (NORVASC) 5 MG  tablet, TAKE ONE TABLET BY MOUTH EVERY DAY, Disp: 90 tablet, Rfl: 1 .  Ibuprofen-Famotidine (DUEXIS) 800-26.6 MG TABS, Take 1 tablet by mouth daily., Disp: , Rfl:  .  levothyroxine (SYNTHROID, LEVOTHROID) 125 MCG tablet, TAKE ONE TABLET BY MOUTH EVERY DAY *NEED APPT FOR MORE REFILLS*, Disp: 90 tablet, Rfl: 1 .  amoxicillin (AMOXIL) 500 MG capsule, 2 caps po tid x 10d (Patient not taking: Reported on 02/28/2019), Disp: 60 capsule, Rfl: 0 .  benzonatate (TESSALON) 200 MG capsule, Take 1 capsule (200 mg total) by mouth 3 (three) times daily as needed for cough. (Patient not taking: Reported on 02/28/2019), Disp: 30 capsule, Rfl: 1 .  predniSONE (DELTASONE) 20 MG tablet, 2 tabs po qd x 5d, then 1 tab po qd x 5d (Patient not taking: Reported on 02/28/2019), Disp: 15 tablet, Rfl: 0  Allergies  Allergen Reactions  . Sulfa Antibiotics Hives    OBJECTIVE: Temp 97.9 F (36.6 C) (Oral)   Ht 5' 4.5" (1.638 m)   Wt 224 lb (101.6 kg)   BMI 37.86 kg/m  Gen: No acute distress. Nontoxic in appearance.  HENT: AT. [place].  MMM.  Eyes:Pupils Equal Round Reactive to light, Extraocular movements intact,  Conjunctiva without redness, discharge or icterus. Chest: Cough or shortness of breath not present Skin/foot: Mild erythema surrounding second and third toes.  She has a blister formation between second and third toe approximately 1 centimeter in  diameter. Neuro:  Alert. Oriented x3    ASSESSMENT AND PLAN: Connie Thompson is a 58 y.o. female present for  Blister of second toe of left foot, initial encounter/Foot pain, left -Odd presentation on exam today with a blister formation dorsal aspect of foot between second and third toes.  Suspect possible insect bite as cause.  Her second toe, third toe and forefoot are swollen.  Does not appear to be tinea related no other erythema or scaliness involved that would be consistent with a tinea pedis.   -Patient was instructed to keep foot elevated whenever possible.  -  Epson  salt warm soaks will be beneficial. -Under for any worsening signs or symptoms of swelling, numbness or discoloration. Discussed differential diagnosis with patient today and she is agreeable to an IM Depo-Medrol injection (nurse visit).  She will start doxycycline for antibiotic coverage.  She will follow-up with PCP in 7 to 10 days, sooner if worsening.   > 25 minutes spent with patient, >50% of time spent face to face     Howard Pouch, DO 02/28/2019

## 2019-03-08 ENCOUNTER — Other Ambulatory Visit: Payer: Self-pay

## 2019-03-08 ENCOUNTER — Encounter: Payer: Self-pay | Admitting: Family Medicine

## 2019-03-08 ENCOUNTER — Ambulatory Visit: Payer: No Typology Code available for payment source | Admitting: Family Medicine

## 2019-03-08 VITALS — BP 132/80 | HR 65 | Temp 98.2°F | Resp 17 | Ht 65.0 in | Wt 222.0 lb

## 2019-03-08 DIAGNOSIS — S90862D Insect bite (nonvenomous), left foot, subsequent encounter: Secondary | ICD-10-CM | POA: Diagnosis not present

## 2019-03-08 DIAGNOSIS — L089 Local infection of the skin and subcutaneous tissue, unspecified: Secondary | ICD-10-CM

## 2019-03-08 DIAGNOSIS — W57XXXD Bitten or stung by nonvenomous insect and other nonvenomous arthropods, subsequent encounter: Secondary | ICD-10-CM | POA: Diagnosis not present

## 2019-03-08 NOTE — Patient Instructions (Signed)
continue daily soak/cleanse and apply antibiotic ointment and clean dressing daily until healed.  It looks so much better.   It was nice to meet you.

## 2019-03-08 NOTE — Progress Notes (Signed)
Connie Thompson , 12/13/1960, 58 y.o., female MRN: [id number] Patient Care Team    Relationship Specialty Notifications Start End  McGowen, Maryjean MornPhilip H, MD PCP - General Family Medicine  02/26/17   Samson FredericSwinteck, Brian, MD Consulting Physician Orthopedic Surgery  01/10/18   Venita SheffieldGoodrich, Stacee S, MD Consulting Physician Obstetrics and Gynecology  06/09/18   Beverely LowNorris, Steve, MD Consulting Physician Orthopedic Surgery  09/28/18   Dominica SeverinGramig, William, MD Consulting Physician Orthopedic Surgery  01/13/19     Chief Complaint  Patient presents with  . Follow-up    Pt states all blisters have popped. It does feel better. After patient recieved injection she was itching on palm of hands and upper arms, last for three days.      Subjective: Connie Thompson is a 58 y.o. female presents for an OV to follow up on allergic reaction of insect bite with infection of the foot. She was seen last week and provided with a depo medrol injection and started on doxy. She has been soaking in epson salt soaks as instructed. Blister finally popped on its own yesterday. She reports welling and redness have resolved. She know thinks she probably did have an insect bite. She recalls slipping on shoes that were outside overnight on her porch prior to  Onset.  She also experienced a mild itchy rash on her arms after having depo medrol that resolved with benadryl. She has had depo medrol injections in the past without reaction. Uncertain if reaction was from injection or more likely the insect bite reaction.  Prior note:  Connie DibbleVivian Thompson is a 58 y.o. female present today for foot pain and swelling of 3 days.  Reports she has noticed swelling of her forefoot and in between her second and third toes she has a blister formation.  There is some mild erythema associated as well.  She denies any fever, chills, nausea or vomit.  She denies any known injury that she is aware of.  She does not recall any insect exposure.  She was at the pool on Thursday and she  worked in her yard on Saturday.  She has had a history of athlete's foot in the past, however this is not similar to those infections.  She does endorse the area is quite itchy.  [facility] 06/07/2018 05/14/2017  Decreased Interest 0 0  Down, Depressed, Hopeless 0 0  PHQ - 2 Score 0 0    Allergies  Allergen Reactions  . Sulfa Antibiotics Hives   Social History   Social History Narrative   Married, 2 grown children, 2 GC.   Moved to [place] area 12/2016.  Pt originally from [place].   Educ: 2 yr college.   Occup: homemaker   No tobacco.   Occ alcohol.   Past Medical History:  Diagnosis Date  . AC separation    Right, grade 3: Dx'd 08/2018  . Allergic dermatitis due to poison vine   . Chronic renal insufficiency, stage 2 (mild) 02/2017   GFR 60s  . First metacarpal bone fracture 05/2017   base, extraarticular--thumb spica placed and referred pt to orthopedics.  . Genital warts   . History of adenomatous polyp of colon 2011   Repeat colonoscopy 2017 normal.  Recall 2022.  Connie Thompson. History of blood transfusion 1984  . Hypertension   . Hypothyroidism   . IFG (impaired fasting glucose)    HbA1c 5.7% June 2019  . Osteoarthritis, multiple sites    Knees: L>R--hx of visco injections and multiple  steroid injections.  Bilat thumb CMC arthritis and right index PIP jt arthritis.   Past Surgical History:  Procedure Laterality Date  . ABDOMINAL HYSTERECTOMY  1987  . COLONOSCOPY  age 41 and again in 2017   Polyps on initial screening colonoscopy, no polyps 2017.  Recall 2022.  Connie Thompson DILATION AND CURETTAGE OF UTERUS  1984   Molar Pregnancy  . OOPHORECTOMY Right 2012  . TONSILLECTOMY  1973  . TREATMENT FISTULA ANAL  2013  . TUBAL LIGATION  1986  . WISDOM TOOTH EXTRACTION  1975   Family History  Problem Relation Age of Onset  . Alcohol abuse Mother   . Stroke Mother   . Hypertension Mother   . Alcohol abuse Father   . Hypertension Father   . COPD Brother   . Lung cancer  Maternal Uncle        smoker  . Alcohol abuse Maternal Uncle   . Stroke Maternal Grandmother   . Hypertension Maternal Grandmother   . Hypertension Brother    Allergies as of 03/08/2019      Reactions   Sulfa Antibiotics Hives      Medication List       Accurate as of March 08, 2019  8:44 AM. If you have any questions, ask your nurse or doctor.        amLODipine 5 MG tablet Commonly known as: NORVASC TAKE ONE TABLET BY MOUTH EVERY DAY   doxycycline 100 MG tablet Commonly known as: VIBRA-TABS Take 1 tablet (100 mg total) by mouth 2 (two) times daily.   Duexis 800-26.6 MG Tabs Generic drug: Ibuprofen-Famotidine Take 1 tablet by mouth daily.   levothyroxine 125 MCG tablet Commonly known as: SYNTHROID TAKE ONE TABLET BY MOUTH EVERY DAY *NEED APPT FOR MORE REFILLS*       All past medical history, surgical history, allergies, family history, immunizations andmedications were updated in the EMR today and reviewed under the history and medication portions of their EMR.     ROS: Negative, with the exception of above mentioned in HPI   Objective:  BP 132/80 (BP Location: Right Arm, Patient Position: Sitting, Cuff Size: Normal)   Pulse 65   Temp 98.2 F (36.8 C) (Temporal)   Resp 17   Ht 5\' 5"  (1.651 m)   Wt 222 lb (100.7 kg)   SpO2 97%   BMI 36.94 kg/m  Body mass index is 36.94 kg/m. Gen: Afebrile. No acute distress. Nontoxic in appearance, well developed, well nourished.  HENT: AT. [place]. MMM Eyes:Pupils Equal Round Reactive to light, Extraocular movements intact,  Conjunctiva without redness, discharge or icterus. Skin: no rashes, No purpura or petechiae. Blister has ruptured btw 2-3 toe, no erythema, no drainage, swelling resolved. NV intact.  Neuro: Normal gait. PERLA. EOMi. Alert. Oriented x3  No exam data present No results found. No results found for this or any previous visit (from the past 24 hour(s)).  Assessment/Plan: Tonique Mendonca is a 58 y.o. female  present for OV for  Foot infection/Insect bite of left foot, subsequent encounter - Swelling and infection appears much improved. No signs of infection today, healing nicely.  - continue course of doxy to completion.  -  Epson salt warm soak daily, redress and apply abx ointment of choice until healed.  - would avoid leaving shoes outside in the future.  - F/U PRN   Reviewed expectations re: course of current medical issues.  Discussed self-management of symptoms.  Outlined signs and symptoms indicating need for more  acute intervention.  Patient verbalized understanding and all questions were answered.  Patient received an After-Visit Summary.    No orders of the defined types were placed in this encounter.    Note is dictated utilizing voice recognition software. Although note has been proof read prior to signing, occasional typographical errors still can be missed. If any questions arise, please do not hesitate to call for verification.   electronically signed by:  Howard Pouch, DO  [place]

## 2019-03-29 ENCOUNTER — Other Ambulatory Visit: Payer: Self-pay | Admitting: Family Medicine

## 2019-03-29 NOTE — Telephone Encounter (Signed)
Called patient in regards to refill request from pharm. Patient has enough amlodipine until her appointment.

## 2019-05-16 DIAGNOSIS — K760 Fatty (change of) liver, not elsewhere classified: Secondary | ICD-10-CM

## 2019-05-16 HISTORY — DX: Fatty (change of) liver, not elsewhere classified: K76.0

## 2019-05-29 ENCOUNTER — Encounter: Payer: BLUE CROSS/BLUE SHIELD | Admitting: Family Medicine

## 2019-06-02 ENCOUNTER — Other Ambulatory Visit: Payer: Self-pay

## 2019-06-02 ENCOUNTER — Ambulatory Visit: Payer: No Typology Code available for payment source | Admitting: Family Medicine

## 2019-06-02 ENCOUNTER — Telehealth: Payer: Self-pay | Admitting: Family Medicine

## 2019-06-02 ENCOUNTER — Encounter: Payer: Self-pay | Admitting: Family Medicine

## 2019-06-02 VITALS — BP 142/78 | HR 62 | Temp 97.5°F | Resp 16 | Ht 65.0 in | Wt 220.0 lb

## 2019-06-02 DIAGNOSIS — R109 Unspecified abdominal pain: Secondary | ICD-10-CM

## 2019-06-02 DIAGNOSIS — R0789 Other chest pain: Secondary | ICD-10-CM

## 2019-06-02 DIAGNOSIS — R1012 Left upper quadrant pain: Secondary | ICD-10-CM

## 2019-06-02 DIAGNOSIS — R079 Chest pain, unspecified: Secondary | ICD-10-CM

## 2019-06-02 LAB — COMPREHENSIVE METABOLIC PANEL
ALT: 14 U/L (ref 0–35)
AST: 14 U/L (ref 0–37)
Albumin: 4.2 g/dL (ref 3.5–5.2)
Alkaline Phosphatase: 73 U/L (ref 39–117)
BUN: 11 mg/dL (ref 6–23)
CO2: 30 mEq/L (ref 19–32)
Calcium: 9.6 mg/dL (ref 8.4–10.5)
Chloride: 103 mEq/L (ref 96–112)
Creatinine, Ser: 0.76 mg/dL (ref 0.40–1.20)
GFR: 77.73 mL/min (ref >60.00)
Glucose, Bld: 95 mg/dL (ref 70–99)
Potassium: 4.6 mEq/L (ref 3.5–5.1)
Sodium: 141 mEq/L (ref 135–145)
Total Bilirubin: 0.5 mg/dL (ref 0.2–1.2)
Total Protein: 6.6 g/dL (ref 6.0–8.3)

## 2019-06-02 LAB — H. PYLORI ANTIBODY, IGG: H Pylori IgG: POSITIVE — AB

## 2019-06-02 LAB — CBC WITH DIFFERENTIAL/PLATELET
Basophils Absolute: 0.1 10*3/uL (ref 0.0–0.1)
Basophils Relative: 1.2 % (ref 0.0–3.0)
Eosinophils Absolute: 0.1 10*3/uL (ref 0.0–0.7)
Eosinophils Relative: 2.2 % (ref 0.0–5.0)
HCT: 41.8 % (ref 36.0–46.0)
Hemoglobin: 13.8 g/dL (ref 12.0–15.0)
Lymphocytes Relative: 21.8 % (ref 12.0–46.0)
Lymphs Abs: 1.3 10*3/uL (ref 0.7–4.0)
MCHC: 33.1 g/dL (ref 30.0–36.0)
MCV: 90.7 fl (ref 78.0–100.0)
Monocytes Absolute: 0.5 10*3/uL (ref 0.1–1.0)
Monocytes Relative: 8.7 % (ref 3.0–12.0)
Neutro Abs: 3.9 10*3/uL (ref 1.4–7.7)
Neutrophils Relative %: 66.1 % (ref 43.0–77.0)
Platelets: 210 10*3/uL (ref 150.0–400.0)
RBC: 4.61 Mil/uL (ref 3.87–5.11)
RDW: 14.7 % (ref 11.5–15.5)
WBC: 5.9 10*3/uL (ref 4.0–10.5)

## 2019-06-02 LAB — LIPASE: Lipase: 21 U/L (ref 11.0–59.0)

## 2019-06-02 MED ORDER — PANTOPRAZOLE SODIUM 40 MG PO TBEC: 40.0000 mg | DELAYED_RELEASE_TABLET | Freq: Every day | ORAL | 3 refills | Status: DC

## 2019-06-02 NOTE — Telephone Encounter (Signed)
Called pt to notify her that all blood tests were normal except H pylori IgG. She told me that she has a remote hx of h pylori + gastritis, therefore I will not treat for this condition at this time. No new plans. She has CXR and abd u/s scheduled for mid next week. Signed:  Crissie Sickles, MD           06/02/2019

## 2019-06-02 NOTE — Progress Notes (Signed)
OFFICE VISIT  06/02/2019   CC:  Chief Complaint  Patient presents with  . Abdominal Pain  . Chest Pain    HPI:    Patient is a 58 y.o. Caucasian female with HTN and Hypothyroidism who presents for pain in abdomen, chest, and also nausea.  HPI: TWo days ago woke up at 4AM with intense pain in L upper abd under rib cage and left inframammary region, pain continued to worsen despite getting up and sitting in recliner.  Got nervous, felt nauseated, felt dizzy. Gradually resolved over the course of the next 6 hours or so, and she just felt "uneasy" all day.   Then 1.5 hrs after lunch she had another episode in mid epigastric area radiating up into sternal area.  She had eaten a bland meal. No meds taken until gaviscon that night. No changes in any habits/routine the day prior to onset of the pain. NO PAIN AT ALL CURRENTLY.  Tends to feel exhausted easier than usual, sweats more easily, but this is not an everyday feeling. No CP.  Occ feeling of trouble feeling like she gets a deep enough breath.  ROS: no CP, no SOB, no fever, no wheezing, no cough, no dizziness, no HAs, no rashes, no melena/hematochezia.  No polyuria or polydipsia.  No arthralgias but some leg cramps lately. Some PND from allergies recently. No legs swelling or asymmetry or pain. No personal or FH of thrombosis.  No recent prolonged immobilization and no recent surgery.  No known hx of CAD. No EKG in EMR. No echo or stress test in EMR. No abd or chest imaging in EMR.  Past Medical History:  Diagnosis Date  . AC separation    Right, grade 3: Dx'd 08/2018  . Allergic dermatitis due to poison vine   . Chronic renal insufficiency, stage 2 (mild) 02/2017   GFR 60s  . First metacarpal bone fracture 05/2017   base, extraarticular--thumb spica placed and referred pt to orthopedics.  . Genital warts   . History of adenomatous polyp of colon 2011   Repeat colonoscopy 2017 normal.  Recall 2022.  [name] History of blood  transfusion 1984  . Hypertension   . Hypothyroidism   . IFG (impaired fasting glucose)    HbA1c 5.7% June 2019  . Osteoarthritis, multiple sites    Knees: L>R--hx of visco injections and multiple steroid injections.  Bilat thumb CMC arthritis and right index PIP jt arthritis.    Past Surgical History:  Procedure Laterality Date  . ABDOMINAL HYSTERECTOMY  1987  . COLONOSCOPY  age 30 and again in 2017   Polyps on initial screening colonoscopy, no polyps 2017.  Recall 2022.  [name] DILATION AND CURETTAGE OF UTERUS  1984   Molar Pregnancy  . OOPHORECTOMY Right 2012  . TONSILLECTOMY  1973  . TREATMENT FISTULA ANAL  2013  . TUBAL LIGATION  1986  . [place] EXTRACTION  1975   Social History   Socioeconomic History  . Marital status: Married    Spouse name: Not on file  . Number of children: Not on file  . Years of education: Not on file  . Highest education level: Not on file  Occupational History  . Not on file  Social Needs  . Financial resource strain: Not on file  . Food insecurity    Worry: Not on file    Inability: Not on file  . Transportation needs    Medical: Not on file    Non-medical: Not on file  Tobacco Use  . Smoking status: Never Smoker  . Smokeless tobacco: Never Used  Substance and Sexual Activity  . Alcohol use: Yes    Alcohol/week: 7.0 standard drinks    Types: 7 Glasses of wine per week  . Drug use: No  . Sexual activity: Not on file  Lifestyle  . Physical activity    Days per week: Not on file    Minutes per session: Not on file  . Stress: Not on file  Relationships  . Social Musician on phone: Not on file    Gets together: Not on file    Attends religious service: Not on file    Active member of club or organization: Not on file    Attends meetings of clubs or organizations: Not on file    Relationship status: Not on file  Other Topics Concern  . Not on file  Social History Narrative   Married, 2 grown children, 2 GC.   Moved  to [place] area 12/2016.  Pt originally from [place].   Educ: 2 yr college.   Occup: homemaker   No tobacco.   Occ alcohol.    Outpatient Medications Prior to Visit  Medication Sig Dispense Refill  . amLODipine (NORVASC) 5 MG tablet TAKE ONE TABLET BY MOUTH EVERY DAY 90 tablet 1  . levothyroxine (SYNTHROID, LEVOTHROID) 125 MCG tablet TAKE ONE TABLET BY MOUTH EVERY DAY *NEED APPT FOR MORE REFILLS* 90 tablet 1  . doxycycline (VIBRA-TABS) 100 MG tablet Take 1 tablet (100 mg total) by mouth 2 (two) times daily. 20 tablet 0  . Ibuprofen-Famotidine (DUEXIS) 800-26.6 MG TABS Take 1 tablet by mouth daily.     No facility-administered medications prior to visit.     Allergies  Allergen Reactions  . Sulfa Antibiotics Hives    ROS As per HPI  PE: Blood pressure (!) 142/78, pulse 62, temperature (!) 97.5 F (36.4 C), temperature source Temporal, resp. rate 16, height 5\' 5"  (1.651 m), weight 220 lb (99.8 kg), SpO2 93 %. Gen: Alert, well appearing.  Patient is oriented to person, place, time, and situation. AFFECT: pleasant, lucid thought and speech. UVO:ZDGU: no injection, icteris, swelling, or exudate.  EOMI, PERRLA. Mouth: lips without lesion/swelling.  Oral mucosa pink and moist. Oropharynx without erythema, exudate, or swelling.  Neck: no JVD CV: RRR, no m/r/g.   LUNGS: CTA bilat, nonlabored resps, good aeration in all lung fields. ABD: soft, NT, ND, BS normal.  No hepatospenomegaly or mass.  No bruits. EXT: no clubbing or cyanosis.  no edema.  SKIN: no pallor, rash, or jaundice Musculoskeletal: no joint swelling, erythema, warmth, or tenderness.  ROM of all joints intact. Neuro: CN 2-12 intact bilaterally, strength 5/5 in proximal and distal upper extremities and lower extremities bilaterally. No tremor.  No disdiadochokinesis.  No ataxia.  LABS:    Chemistry      Component Value Date/Time   NA 141 06/02/2019 0920   K 4.6 06/02/2019 0920   CL 103 06/02/2019 0920   CO2 30 06/02/2019  0920   BUN 11 06/02/2019 0920   CREATININE 0.76 06/02/2019 0920      Component Value Date/Time   CALCIUM 9.6 06/02/2019 0920   ALKPHOS 73 06/02/2019 0920   AST 14 06/02/2019 0920   ALT 14 06/02/2019 0920   BILITOT 0.5 06/02/2019 0920     Lab Results  Component Value Date   WBC 5.9 06/02/2019   HGB 13.8 06/02/2019   HCT 41.8 06/02/2019  MCV 90.7 06/02/2019   PLT 210.0 06/02/2019   Lab Results  Component Value Date   HGBA1C 5.7 03/03/2018   12 lead EKG today: NSR, rate 61, no ectopy, normal intervals and duration, no LVH, no ischemic changes.  No prior EKG for comparison.  IMPRESSION AND PLAN:  1) Episodic abd pain and left chest pain. Unknown etiology. I'm more suspicious of GI etiology (gastritis vs symptomatic cholelithiasis) than cardiac or pulmonary etiology at this time. Will check CXR, complete abd u/s, CBC, CMET, lipase, and H pylori ab.   Start pantoprazole 40mg  qd and also start ASA 81mg  qd. DDX also PE, angina,  If all testing normal then will likely do myocardial perfusion imaging vs CT angio.  An After Visit Summary was printed and given to the patient.  FOLLOW UP: Return today (on 06/02/2019) for f/u abd/chest pain.  Signed:  Santiago BumpersPhil Sarh Kirschenbaum, MD           06/02/2019

## 2019-06-07 ENCOUNTER — Ambulatory Visit
Admission: RE | Admit: 2019-06-07 | Discharge: 2019-06-07 | Disposition: A | Discharge status: Home / Self Care | Payer: No Typology Code available for payment source | Source: Ambulatory Visit | Attending: Family Medicine | Admitting: Family Medicine

## 2019-06-07 ENCOUNTER — Encounter: Payer: Self-pay | Admitting: Family Medicine

## 2019-06-07 DIAGNOSIS — R1012 Left upper quadrant pain: Secondary | ICD-10-CM

## 2019-06-07 DIAGNOSIS — R0789 Other chest pain: Secondary | ICD-10-CM

## 2019-06-07 DIAGNOSIS — R109 Unspecified abdominal pain: Secondary | ICD-10-CM

## 2019-06-07 DIAGNOSIS — R079 Chest pain, unspecified: Secondary | ICD-10-CM

## 2019-06-08 ENCOUNTER — Other Ambulatory Visit: Payer: Self-pay

## 2019-06-08 DIAGNOSIS — K8020 Calculus of gallbladder without cholecystitis without obstruction: Secondary | ICD-10-CM

## 2019-06-09 ENCOUNTER — Encounter: Payer: Self-pay | Admitting: Family Medicine

## 2019-06-09 ENCOUNTER — Ambulatory Visit (INDEPENDENT_AMBULATORY_CARE_PROVIDER_SITE_OTHER): Payer: No Typology Code available for payment source | Admitting: Family Medicine

## 2019-06-09 ENCOUNTER — Other Ambulatory Visit: Payer: Self-pay

## 2019-06-09 VITALS — BP 142/82 | HR 63 | Temp 98.5°F | Resp 16 | Ht 65.0 in | Wt 222.5 lb

## 2019-06-09 DIAGNOSIS — K8020 Calculus of gallbladder without cholecystitis without obstruction: Secondary | ICD-10-CM

## 2019-06-09 NOTE — Progress Notes (Signed)
OFFICE VISIT  06/09/2019   CC:  Chief Complaint  Patient presents with  . Follow-up    abd and chest pain. she is bloated but doesnt have SOB or palpitations   HPI:    Patient is a 58 y.o. Caucasian female who presents for 1 wk f/u episodic abd pain and L chest pain. Labs were all normal except H pylori IgG---however, see PMH section for details. CXR was normal.  I started her on pantoprazole qd and ASA 81mg  qd.   Complete abd u/s showed one large gallstone w/out sign of cholecystitis.  Also hepatic steatosis. Yesterday I referred her to gen surg.  Interim hx: Still feeling bloated and full, worse after eating but never really goes away now.  No classic GB "attacks" since I saw her last.  No nausea.  Appetite is fine. She has had a few loose BMs but nothing persistent.   Location of discomfort in abd is mid and L upper abd. No urinary complaints.  No fevers, cough or sob.  Prior to onset of recent GI problems, she was acitve and had no CP, SOB, palpitations, DOE, dizziness, PND, orthopnea, or LE swelling.  She has had general anesthesia on a few occasions and has not had any problems with it.    No known hx of CAD, CHF, arrhythmia, or cardiac valvular d/o.   Past Medical History:  Diagnosis Date  . AC separation    Right, grade 3: Dx'd 08/2018  . Allergic dermatitis due to poison vine   . Chronic renal insufficiency, stage 2 (mild) 02/2017   GFR 60s  . Fatty liver 05/2019  . First metacarpal bone fracture 05/2017   base, extraarticular--thumb spica placed and referred pt to orthopedics.  . Genital warts   . History of adenomatous polyp of colon 2011   Repeat colonoscopy 2017 normal.  Recall 2022.  [name] History of blood transfusion 1984  . History of Helicobacter pylori infection remote past  . Hypertension   . Hypothyroidism   . IFG (impaired fasting glucose)    HbA1c 5.7% June 2019  . Osteoarthritis, multiple sites    Knees: L>R--hx of visco injections and multiple  steroid injections.  Bilat thumb CMC arthritis and right index PIP jt arthritis.  . Symptomatic cholelithiasis 05/2019   referred to gen surg 06/08/19.    Past Surgical History:  Procedure Laterality Date  . ABDOMINAL HYSTERECTOMY  1987  . COLONOSCOPY  age 64 and again in 2017   Polyps on initial screening colonoscopy, no polyps 2017.  Recall 2022.  [name] DILATION AND CURETTAGE OF UTERUS  1984   Molar Pregnancy  . OOPHORECTOMY Right 2012  . TONSILLECTOMY  1973  . TREATMENT FISTULA ANAL  2013  . TUBAL LIGATION  1986  . [facility]    Outpatient Medications Prior to Visit  Medication Sig Dispense Refill  . amLODipine (NORVASC) 5 MG tablet TAKE ONE TABLET BY MOUTH EVERY DAY 90 tablet 1  . levothyroxine (SYNTHROID, LEVOTHROID) 125 MCG tablet TAKE ONE TABLET BY MOUTH EVERY DAY *NEED APPT FOR MORE REFILLS* 90 tablet 1  . aspirin EC 81 MG tablet Take 81 mg by mouth daily.    . pantoprazole (PROTONIX) 40 MG tablet Take 1 tablet (40 mg total) by mouth daily. 30 tablet 3  . Ibuprofen-Famotidine (DUEXIS) 800-26.6 MG TABS Take 1 tablet by mouth daily.     No facility-administered medications prior to visit.     Allergies  Allergen Reactions  .  Sulfa Antibiotics Hives    ROS As per HPI  PE: Blood pressure (!) 142/82, pulse 63, temperature 98.5 F (36.9 C), temperature source Temporal, resp. rate 16, height 5\' 5"  (1.651 m), weight 222 lb 8 oz (100.9 kg), SpO2 95 %. Gen: Alert, well appearing.  Patient is oriented to person, place, time, and situation. AFFECT: pleasant, lucid thought and speech. No further exam today.  LABS:   Lab Results  Component Value Date   LIPASE 21.0 06/02/2019    Lab Results  Component Value Date   TSH 0.78 03/02/2018   Lab Results  Component Value Date   WBC 5.9 06/02/2019   HGB 13.8 06/02/2019   HCT 41.8 06/02/2019   MCV 90.7 06/02/2019   PLT 210.0 06/02/2019   Lab Results  Component Value Date   CREATININE 0.76 06/02/2019    BUN 11 06/02/2019   NA 141 06/02/2019   K 4.6 06/02/2019   CL 103 06/02/2019   CO2 30 06/02/2019   Lab Results  Component Value Date   ALT 14 06/02/2019   AST 14 06/02/2019   ALKPHOS 73 06/02/2019   BILITOT 0.5 06/02/2019   Lab Results  Component Value Date   CHOL 172 03/02/2018   Lab Results  Component Value Date   HDL 53.20 03/02/2018   Lab Results  Component Value Date   LDLCALC 99 03/02/2018   Lab Results  Component Value Date   TRIG 102.0 03/02/2018   Lab Results  Component Value Date   CHOLHDL 3 03/02/2018     IMPRESSION AND PLAN:  1) Symptomatic cholelithiasis. Reviewed all labs and imaging.  Encouraged wt loss as treatment for her fatty liver. Expecting gen surg consult appt soon. Can d/c pantoprazole and ASA that I had started her on last visit. Current med problems are optimally managed at this time, and she would be low risk for surgery.  An After Visit Summary was printed and given to the patient.  FOLLOW UP: Return in about 3 months (around 09/08/2019) for annual CPE (fasting).  Signed:  09/10/2019, MD           06/09/2019

## 2019-06-15 HISTORY — PX: LAPAROSCOPIC CHOLECYSTECTOMY: SUR755

## 2019-06-16 ENCOUNTER — Encounter: Payer: No Typology Code available for payment source | Admitting: Family Medicine

## 2019-06-23 DIAGNOSIS — K8020 Calculus of gallbladder without cholecystitis without obstruction: Secondary | ICD-10-CM | POA: Insufficient documentation

## 2019-06-27 IMAGING — DX DG FINGER THUMB 2+V*R*
3 series · 3 of 3 positions shown · non-contrast
Comparison: None.

CLINICAL DATA: Fall several days ago with persistent thumb pain,
initial encounter

EXAM:
RIGHT THUMB 2+V

[finger ap]
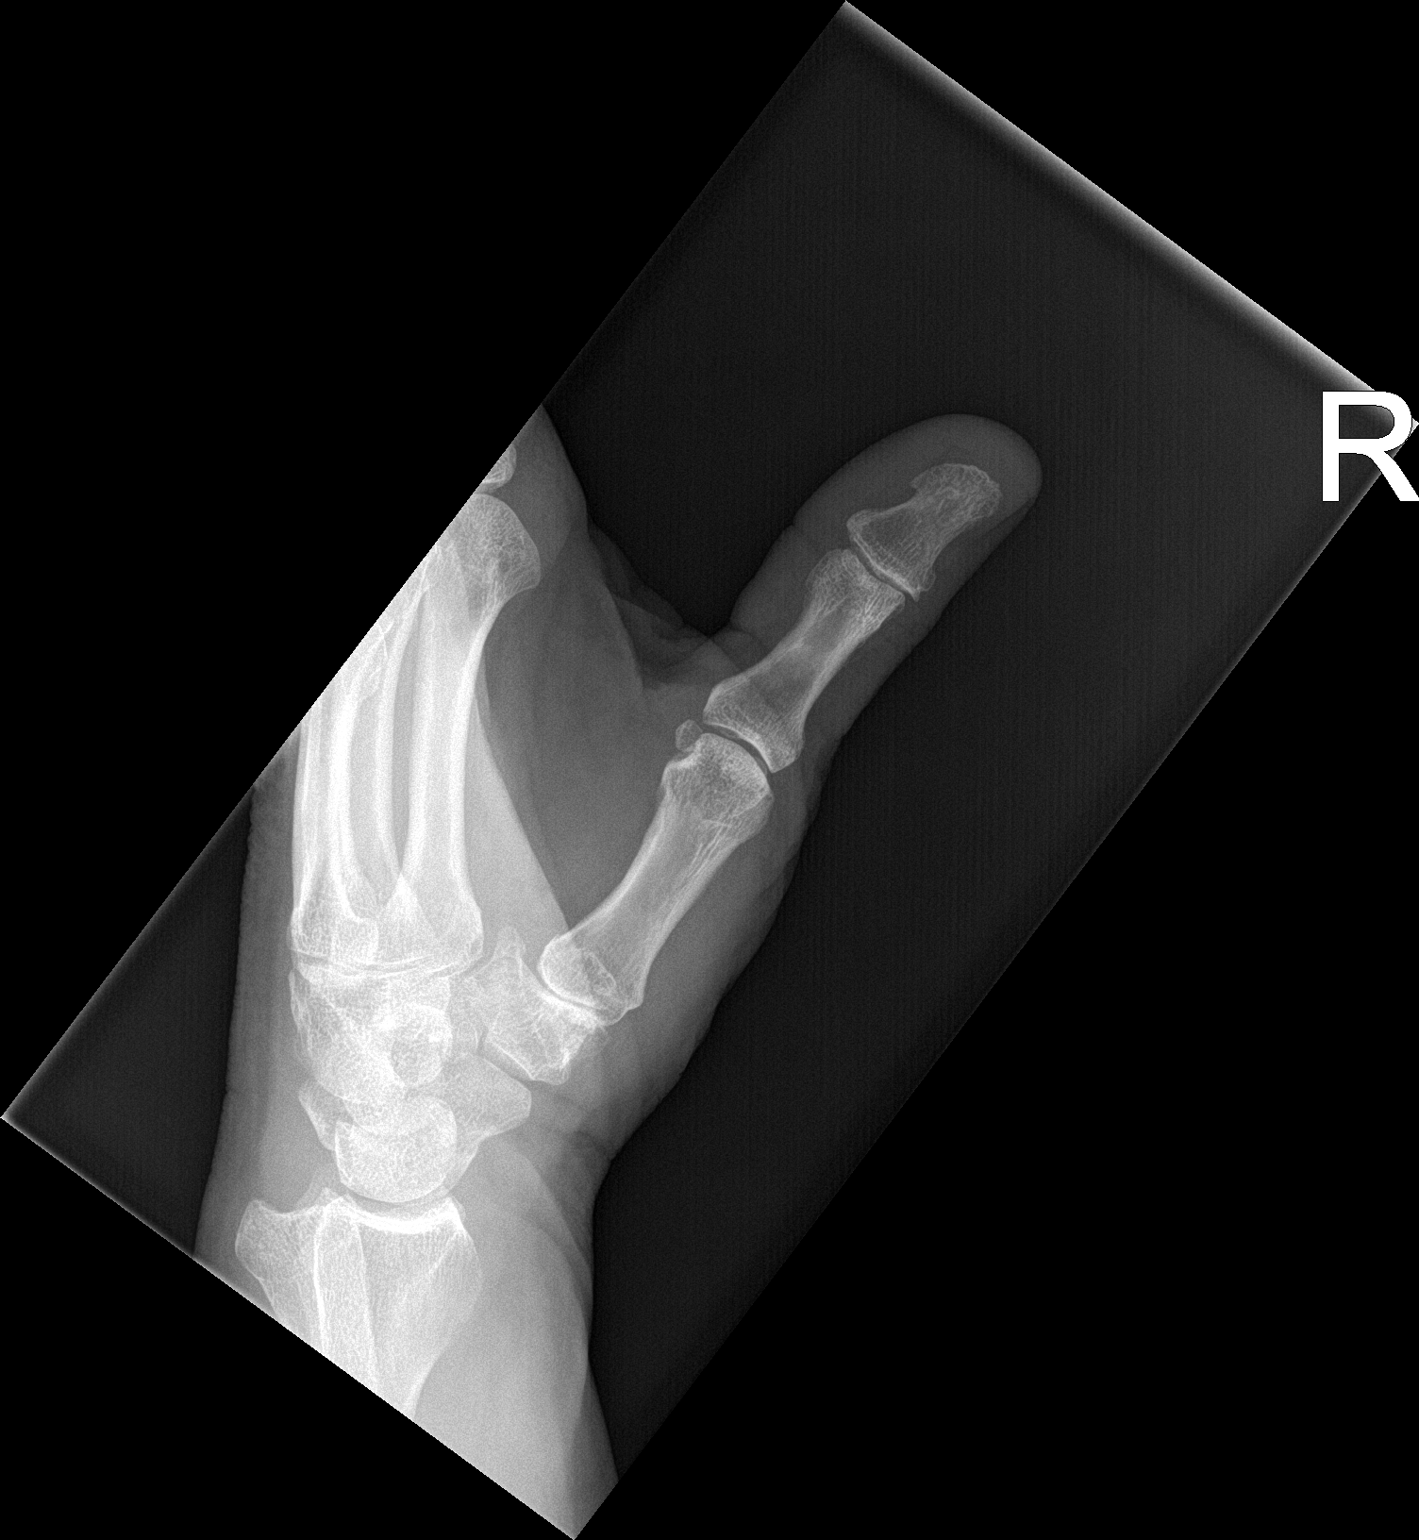

[finger obl]
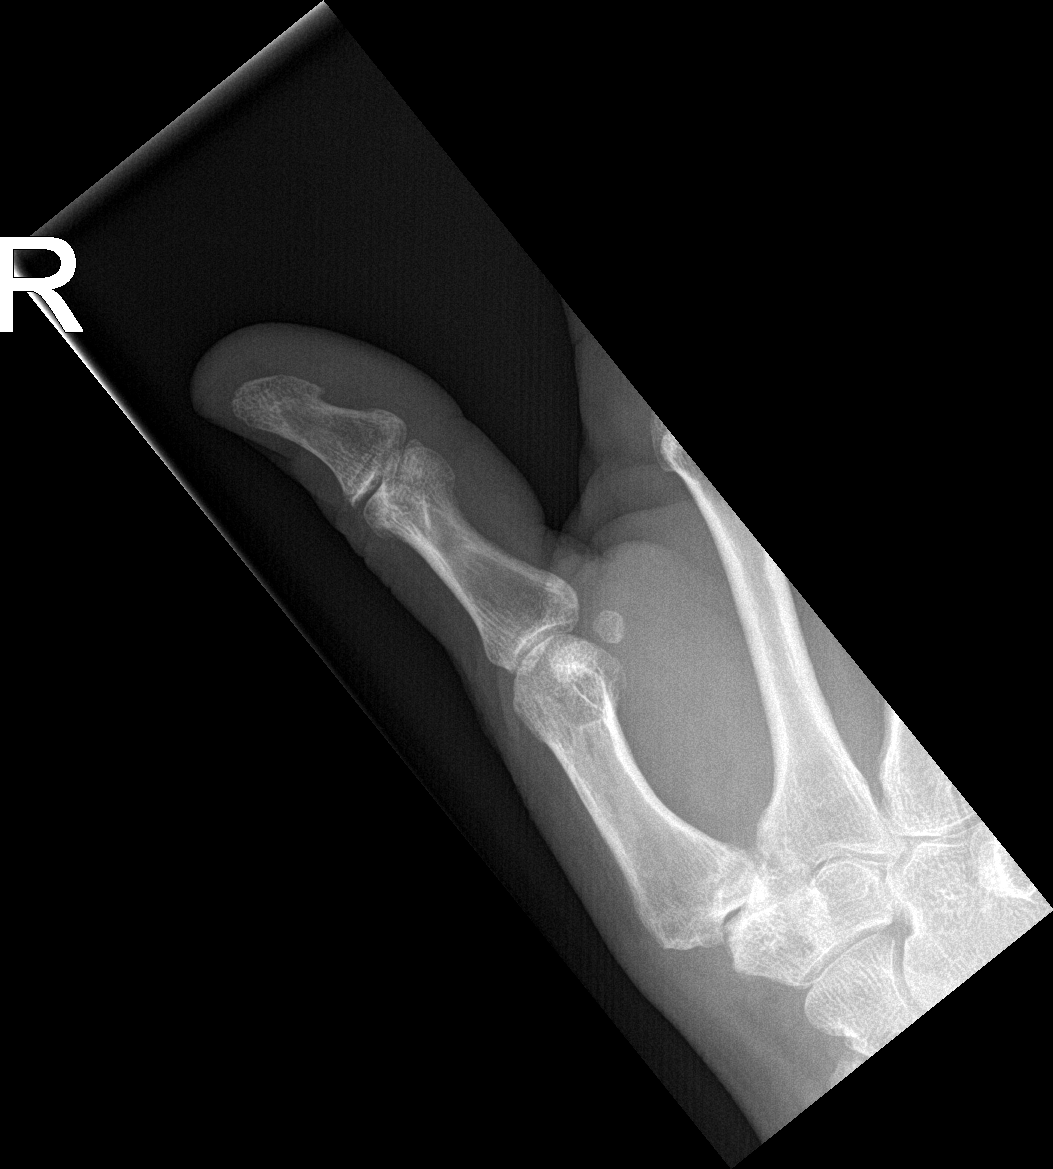

[finger lat]
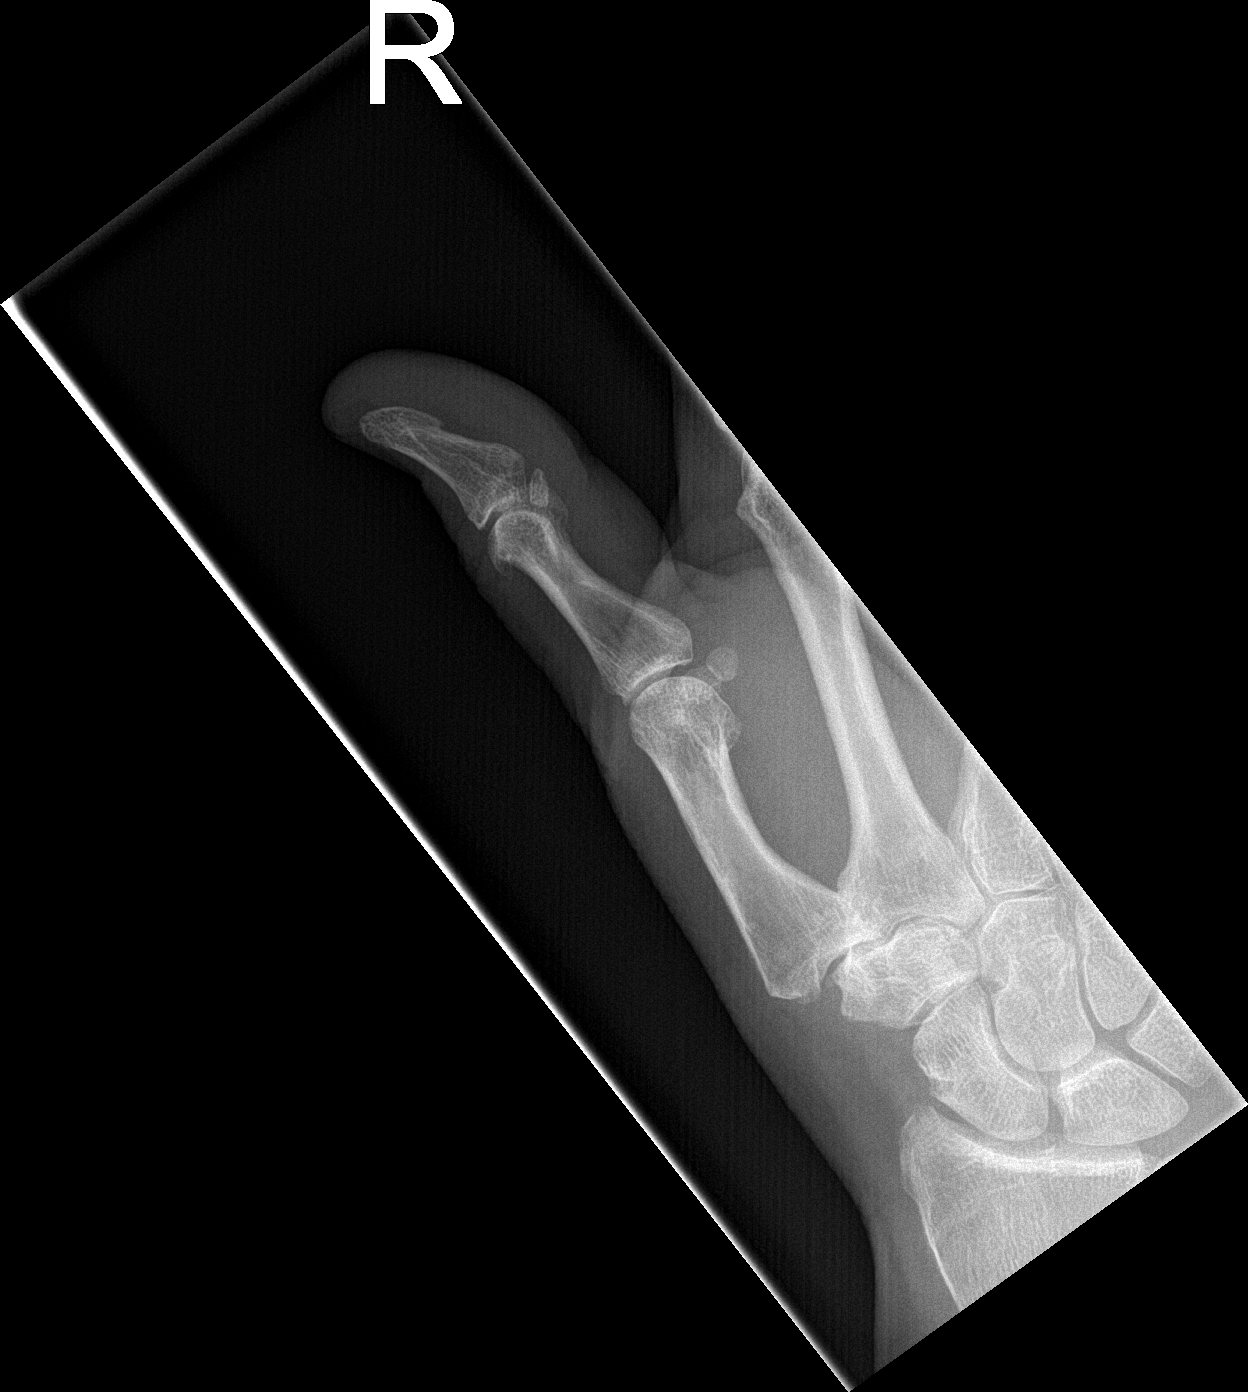

[3 of 3 positions shown; findings below may reference images not displayed]

FINDINGS: Degenerative changes are noted at the first CMC joint. Some mild
irregularity at the base of the first proximal phalanx is noted
suspicious for undisplaced fracture. No other bony abnormality is
seen.
IMPRESSION: Changes suspicious for undisplaced fracture at the base of the first
metacarpal.

## 2019-06-29 LAB — HM MAMMOGRAPHY

## 2019-07-04 ENCOUNTER — Encounter: Payer: Self-pay | Admitting: Family Medicine

## 2019-07-04 ENCOUNTER — Telehealth: Payer: Self-pay

## 2019-07-04 ENCOUNTER — Other Ambulatory Visit: Payer: Self-pay

## 2019-07-04 NOTE — Telephone Encounter (Signed)
Received pt's mammogram results. Given to PCP for review. HM updated as well.

## 2019-07-05 ENCOUNTER — Other Ambulatory Visit: Payer: Self-pay | Admitting: Family Medicine

## 2019-07-05 NOTE — Telephone Encounter (Signed)
MyChart message read.

## 2019-07-05 NOTE — Telephone Encounter (Signed)
Pls notify pt that her mammogram was NORMAL. Recommend repeat in 1 yr.

## 2019-07-05 NOTE — Telephone Encounter (Signed)
LM for pt to return call to discuss results/recommendations or check MyChart.

## 2019-08-02 ENCOUNTER — Other Ambulatory Visit: Payer: Self-pay | Admitting: Family Medicine

## 2019-09-05 ENCOUNTER — Encounter: Payer: Self-pay | Admitting: Family Medicine

## 2019-09-05 ENCOUNTER — Other Ambulatory Visit: Payer: Self-pay

## 2019-09-05 ENCOUNTER — Ambulatory Visit (INDEPENDENT_AMBULATORY_CARE_PROVIDER_SITE_OTHER): Payer: No Typology Code available for payment source | Admitting: Family Medicine

## 2019-09-05 VITALS — BP 114/74 | HR 56 | Temp 97.9°F | Resp 16 | Ht 65.0 in | Wt 212.4 lb

## 2019-09-05 DIAGNOSIS — R7301 Impaired fasting glucose: Secondary | ICD-10-CM

## 2019-09-05 DIAGNOSIS — Z0000 Encounter for general adult medical examination without abnormal findings: Secondary | ICD-10-CM | POA: Diagnosis not present

## 2019-09-05 DIAGNOSIS — I10 Essential (primary) hypertension: Secondary | ICD-10-CM

## 2019-09-05 DIAGNOSIS — E2839 Other primary ovarian failure: Secondary | ICD-10-CM

## 2019-09-05 DIAGNOSIS — E039 Hypothyroidism, unspecified: Secondary | ICD-10-CM | POA: Diagnosis not present

## 2019-09-05 DIAGNOSIS — N182 Chronic kidney disease, stage 2 (mild): Secondary | ICD-10-CM

## 2019-09-05 DIAGNOSIS — E669 Obesity, unspecified: Secondary | ICD-10-CM | POA: Insufficient documentation

## 2019-09-05 LAB — CBC WITH DIFFERENTIAL/PLATELET
Basophils Absolute: 0.1 10*3/uL (ref 0.0–0.1)
Basophils Relative: 1.4 % (ref 0.0–3.0)
Eosinophils Absolute: 0.3 10*3/uL (ref 0.0–0.7)
Eosinophils Relative: 4.3 % (ref 0.0–5.0)
HCT: 44.1 % (ref 36.0–46.0)
Hemoglobin: 14.2 g/dL (ref 12.0–15.0)
Lymphocytes Relative: 18.6 % (ref 12.0–46.0)
Lymphs Abs: 1.2 10*3/uL (ref 0.7–4.0)
MCHC: 32.1 g/dL (ref 30.0–36.0)
MCV: 90.5 fl (ref 78.0–100.0)
Monocytes Absolute: 0.4 10*3/uL (ref 0.1–1.0)
Monocytes Relative: 6.9 % (ref 3.0–12.0)
Neutro Abs: 4.4 10*3/uL (ref 1.4–7.7)
Neutrophils Relative %: 68.8 % (ref 43.0–77.0)
Platelets: 255 10*3/uL (ref 150.0–400.0)
RBC: 4.88 Mil/uL (ref 3.87–5.11)
RDW: 14.3 % (ref 11.5–15.5)
WBC: 6.4 10*3/uL (ref 4.0–10.5)

## 2019-09-05 LAB — COMPREHENSIVE METABOLIC PANEL
ALT: 19 U/L (ref 0–35)
AST: 18 U/L (ref 0–37)
Albumin: 4.3 g/dL (ref 3.5–5.2)
Alkaline Phosphatase: 68 U/L (ref 39–117)
BUN: 11 mg/dL (ref 6–23)
CO2: 28 mEq/L (ref 19–32)
Calcium: 9.5 mg/dL (ref 8.4–10.5)
Chloride: 106 mEq/L (ref 96–112)
Creatinine, Ser: 0.81 mg/dL (ref 0.40–1.20)
GFR: 72.16 mL/min (ref >60.00)
Glucose, Bld: 100 mg/dL — ABNORMAL HIGH (ref 70–99)
Potassium: 4.4 mEq/L (ref 3.5–5.1)
Sodium: 141 mEq/L (ref 135–145)
Total Bilirubin: 0.6 mg/dL (ref 0.2–1.2)
Total Protein: 6.5 g/dL (ref 6.0–8.3)

## 2019-09-05 LAB — LIPID PANEL
Cholesterol: 180 mg/dL (ref 0–200)
HDL: 49 mg/dL (ref >39.00)
LDL Cholesterol: 106 mg/dL — ABNORMAL HIGH (ref 0–99)
NonHDL: 130.85
Total CHOL/HDL Ratio: 4
Triglycerides: 122 mg/dL (ref 0.0–149.0)
VLDL: 24.4 mg/dL (ref 0.0–40.0)

## 2019-09-05 LAB — HEMOGLOBIN A1C: Hgb A1c MFr Bld: 5.7 % (ref 4.6–6.5)

## 2019-09-05 LAB — TSH: TSH: 0.41 u[IU]/mL (ref 0.35–4.50)

## 2019-09-05 MED ORDER — AMLODIPINE BESYLATE 5 MG PO TABS: 5.0000 mg | ORAL_TABLET | Freq: Every day | ORAL | 3 refills | Status: AC

## 2019-09-05 MED ORDER — LEVOTHYROXINE SODIUM 125 MCG PO TABS: ORAL_TABLET | ORAL | 3 refills | Status: AC

## 2019-09-05 NOTE — Patient Instructions (Signed)
Health Maintenance, Female Adopting a healthy lifestyle and getting preventive care are important in promoting health and wellness. Ask your health care provider about:  The right schedule for you to have regular tests and exams.  Things you can do on your own to prevent diseases and keep yourself healthy. What should I know about diet, weight, and exercise? Eat a healthy diet   Eat a diet that includes plenty of vegetables, fruits, low-fat dairy products, and lean protein.  Do not eat a lot of foods that are high in solid fats, added sugars, or sodium. Maintain a healthy weight Body mass index (BMI) is used to identify weight problems. It estimates body fat based on height and weight. Your health care provider can help determine your BMI and help you achieve or maintain a healthy weight. Get regular exercise Get regular exercise. This is one of the most important things you can do for your health. Most adults should:  Exercise for at least 150 minutes each week. The exercise should increase your heart rate and make you sweat (moderate-intensity exercise).  Do strengthening exercises at least twice a week. This is in addition to the moderate-intensity exercise.  Spend less time sitting. Even light physical activity can be beneficial. Watch cholesterol and blood lipids Have your blood tested for lipids and cholesterol at 59 years of age, then have this test every 5 years. Have your cholesterol levels checked more often if:  Your lipid or cholesterol levels are high.  You are older than 59 years of age.  You are at high risk for heart disease. What should I know about cancer screening? Depending on your health history and family history, you may need to have cancer screening at various ages. This may include screening for:  Breast cancer.  Cervical cancer.  Colorectal cancer.  Skin cancer.  Lung cancer. What should I know about heart disease, diabetes, and high blood  pressure? Blood pressure and heart disease  High blood pressure causes heart disease and increases the risk of stroke. This is more likely to develop in people who have high blood pressure readings, are of African descent, or are overweight.  Have your blood pressure checked: ? Every 3-5 years if you are 18-39 years of age. ? Every year if you are 40 years old or older. Diabetes Have regular diabetes screenings. This checks your fasting blood sugar level. Have the screening done:  Once every three years after age 40 if you are at a normal weight and have a low risk for diabetes.  More often and at a younger age if you are overweight or have a high risk for diabetes. What should I know about preventing infection? Hepatitis B If you have a higher risk for hepatitis B, you should be screened for this virus. Talk with your health care provider to find out if you are at risk for hepatitis B infection. Hepatitis C Testing is recommended for:  Everyone born from 1945 through 1965.  Anyone with known risk factors for hepatitis C. Sexually transmitted infections (STIs)  Get screened for STIs, including gonorrhea and chlamydia, if: ? You are sexually active and are younger than 59 years of age. ? You are older than 59 years of age and your health care provider tells you that you are at risk for this type of infection. ? Your sexual activity has changed since you were last screened, and you are at increased risk for chlamydia or gonorrhea. Ask your health care provider if   you are at risk.  Ask your health care provider about whether you are at high risk for HIV. Your health care provider may recommend a prescription medicine to help prevent HIV infection. If you choose to take medicine to prevent HIV, you should first get tested for HIV. You should then be tested every 3 months for as long as you are taking the medicine. Pregnancy  If you are about to stop having your period (premenopausal) and  you may become pregnant, seek counseling before you get pregnant.  Take 400 to 800 micrograms (mcg) of folic acid every day if you become pregnant.  Ask for birth control (contraception) if you want to prevent pregnancy. Osteoporosis and menopause Osteoporosis is a disease in which the bones lose minerals and strength with aging. This can result in bone fractures. If you are 65 years old or older, or if you are at risk for osteoporosis and fractures, ask your health care provider if you should:  Be screened for bone loss.  Take a calcium or vitamin D supplement to lower your risk of fractures.  Be given hormone replacement therapy (HRT) to treat symptoms of menopause. Follow these instructions at home: Lifestyle  Do not use any products that contain nicotine or tobacco, such as cigarettes, e-cigarettes, and chewing tobacco. If you need help quitting, ask your health care provider.  Do not use street drugs.  Do not share needles.  Ask your health care provider for help if you need support or information about quitting drugs. Alcohol use  Do not drink alcohol if: ? Your health care provider tells you not to drink. ? You are pregnant, may be pregnant, or are planning to become pregnant.  If you drink alcohol: ? Limit how much you use to 0-1 drink a day. ? Limit intake if you are breastfeeding.  Be aware of how much alcohol is in your drink. In the [place] one drink equals one 12 oz bottle of beer (355 mL), one 5 oz glass of wine (148 mL), or one 1 oz glass of hard liquor (44 mL). General instructions  Schedule regular health, dental, and eye exams.  Stay current with your vaccines.  Tell your health care provider if: ? You often feel depressed. ? You have ever been abused or do not feel safe at home. Summary  Adopting a healthy lifestyle and getting preventive care are important in promoting health and wellness.  Follow your health care provider's instructions about healthy  diet, exercising, and getting tested or screened for diseases.  Follow your health care provider's instructions on monitoring your cholesterol and blood pressure. This information is not intended to replace advice given to you by your health care provider. Make sure you discuss any questions you have with your health care provider. Document Released: 03/16/2011 Document Revised: 08/24/2018 Document Reviewed: 08/24/2018 Elsevier Patient Education  2020 Elsevier Inc.  

## 2019-09-05 NOTE — Progress Notes (Signed)
Office Note 09/05/2019  CC:  Chief Complaint  Patient presents with  . Annual Exam    pt is fasting    HPI:  Connie Thompson is a 58 y.o. White female with HTN, hypothyroidism, GERD, CRI with GFR in the 60s, and IFG who is here for annual health maintenance exam. She does see a GYN.  Has had some severe episodes of GERD to the point of getting nauseated and actually having nonbilious emesis. No abd pain.  Resolves w/in an hour or two.  One episode included some loose stools.  Gallbladder taken out since I last saw her. Diet improved and has lost 10 lbs! No formal exercise BUT chases grandkids around constantly.   Past Medical History:  Diagnosis Date  . AC separation    Right, grade 3: Dx'd 08/2018  . Allergic dermatitis due to poison vine   . Chronic renal insufficiency, stage 2 (mild) 02/2017   GFR 60s  . Fatty liver 05/2019  . First metacarpal bone fracture 05/2017   base, extraarticular--thumb spica placed and referred pt to orthopedics.  . Genital warts   . History of adenomatous polyp of colon 2011   Repeat colonoscopy 2017 normal.  Recall 2022.  [name]. History of blood transfusion 1984  . History of Helicobacter pylori infection remote past  . Hypertension   . Hypothyroidism   . IFG (impaired fasting glucose)    HbA1c 5.7% June 2019  . Osteoarthritis, multiple sites    Knees: L>R--hx of visco injections and multiple steroid injections.  Bilat thumb CMC arthritis and right index PIP jt arthritis.    Past Surgical History:  Procedure Laterality Date  . ABDOMINAL HYSTERECTOMY  1987  . COLONOSCOPY  age 89 and again in 2017   Polyps on initial screening colonoscopy, no polyps 2017.  Recall 2022.  [name]. DILATION AND CURETTAGE OF UTERUS  1984   Molar Pregnancy  . LAPAROSCOPIC CHOLECYSTECTOMY  06/2019   [facility]   . OOPHORECTOMY Right 2012  . TONSILLECTOMY  1973  . TREATMENT FISTULA ANAL  2013  . TUBAL LIGATION  1986  . WISDOM TOOTH EXTRACTION  1975    Family History   Problem Relation Age of Onset  . Alcohol abuse Mother   . Stroke Mother   . Hypertension Mother   . Alcohol abuse Father   . Hypertension Father   . COPD Brother   . Lung cancer Maternal Uncle        smoker  . Alcohol abuse Maternal Uncle   . Stroke Maternal Grandmother   . Hypertension Maternal Grandmother   . Hypertension Brother     Social History   Socioeconomic History  . Marital status: Married    Spouse name: Not on file  . Number of children: Not on file  . Years of education: Not on file  . Highest education level: Not on file  Occupational History  . Not on file  Tobacco Use  . Smoking status: Never Smoker  . Smokeless tobacco: Never Used  Substance and Sexual Activity  . Alcohol use: Yes    Alcohol/week: 7.0 standard drinks    Types: 7 Glasses of wine per week  . Drug use: No  . Sexual activity: Not on file  Other Topics Concern  . Not on file  Social History Narrative   Married, 2 grown children, 2 GC.   Moved to [place] area 12/2016.  [place] originally from [place].   Educ: 2 yr college.   Occup: homemaker  No tobacco.   Occ alcohol.   Social Determinants of Health   Financial Resource Strain:   . Difficulty of Paying Living Expenses: Not on file  Food Insecurity:   . Worried About Programme researcher, broadcasting/film/video in the Last Year: Not on file  . Ran Out of Food in the Last Year: Not on file  Transportation Needs:   . Lack of Transportation (Medical): Not on file  . Lack of Transportation (Non-Medical): Not on file  Physical Activity:   . Days of Exercise per Week: Not on file  . Minutes of Exercise per Session: Not on file  Stress:   . Feeling of Stress : Not on file  Social Connections:   . Frequency of Communication with Friends and Family: Not on file  . Frequency of Social Gatherings with Friends and Family: Not on file  . Attends Religious Services: Not on file  . Active Member of Clubs or Organizations: Not on file  . Attends Banker  Meetings: Not on file  . Marital Status: Not on file  Intimate Partner Violence:   . Fear of Current or Ex-Partner: Not on file  . Emotionally Abused: Not on file  . Physically Abused: Not on file  . Sexually Abused: Not on file    Outpatient Medications Prior to Visit  Medication Sig Dispense Refill  . amLODipine (NORVASC) 5 MG tablet TAKE ONE TABLET BY MOUTH EVERY DAY 30 tablet 1  . levothyroxine (SYNTHROID) 125 MCG tablet TAKE ONE TABLET BY MOUTH EVERY DAY *NEED APPT FOR MORE REFILLS* 90 tablet 0  . pantoprazole (PROTONIX) 40 MG tablet      No facility-administered medications prior to visit.    Allergies  Allergen Reactions  . Sulfa Antibiotics Hives    ROS Review of Systems  Constitutional: Negative for appetite change, chills, fatigue and fever.  HENT: Negative for congestion, dental problem, ear pain and sore throat.   Eyes: Negative for discharge, redness and visual disturbance.  Respiratory: Negative for cough, chest tightness, shortness of breath and wheezing.   Cardiovascular: Negative for chest pain, palpitations and leg swelling.  Gastrointestinal: Negative for abdominal pain, blood in stool, diarrhea, nausea and vomiting.  Genitourinary: Negative for difficulty urinating, dysuria, flank pain, frequency, hematuria and urgency.  Musculoskeletal: Negative for arthralgias, back pain, joint swelling, myalgias and neck stiffness.  Skin: Negative for pallor and rash.  Neurological: Negative for dizziness, speech difficulty, weakness and headaches.  Hematological: Negative for adenopathy. Does not bruise/bleed easily.  Psychiatric/Behavioral: Negative for confusion and sleep disturbance. The patient is not nervous/anxious.     PE; Blood pressure 114/74, pulse (!) 56, temperature 97.9 F (36.6 C), temperature source Temporal, resp. rate 16, height 5\' 5"  (1.651 m), weight 212 lb 6.4 oz (96.3 kg), SpO2 96 %. Body mass index is 35.35 kg/m. Exam chaperoned by , CMA.  Gen: Alert, well appearing.  Patient is oriented to person, place, time, and situation. AFFECT: pleasant, lucid thought and speech. ENT: Ears: EACs clear, normal epithelium.  TMs with good light reflex and landmarks bilaterally.  Eyes: no injection, icteris, swelling, or exudate.  EOMI, PERRLA. Nose: no drainage or turbinate edema/swelling.  No injection or focal lesion.  Mouth: lips without lesion/swelling.  Oral mucosa pink and moist.  Dentition intact and without obvious caries or gingival swelling.  Oropharynx without erythema, exudate, or swelling.  Neck: supple/nontender.  No LAD, mass, or TM.  Carotid pulses 2+ bilaterally, without bruits. CV: RRR, no m/r/g.  LUNGS: CTA bilat, nonlabored resps, good aeration in all lung fields. ABD: soft, NT, ND, BS normal.  No hepatospenomegaly or mass.  No bruits. EXT: no clubbing, cyanosis, or edema.  Musculoskeletal: no joint swelling, erythema, warmth, or tenderness.  ROM of all joints intact. Skin - no sores or suspicious lesions or rashes or color changes   Pertinent labs:  Lab Results  Component Value Date   TSH 0.78 03/02/2018   Lab Results  Component Value Date   WBC 5.9 06/02/2019   HGB 13.8 06/02/2019   HCT 41.8 06/02/2019   MCV 90.7 06/02/2019   PLT 210.0 06/02/2019   Lab Results  Component Value Date   CREATININE 0.76 06/02/2019   BUN 11 06/02/2019   NA 141 06/02/2019   K 4.6 06/02/2019   CL 103 06/02/2019   CO2 30 06/02/2019   Lab Results  Component Value Date   ALT 14 06/02/2019   AST 14 06/02/2019   ALKPHOS 73 06/02/2019   BILITOT 0.5 06/02/2019   Lab Results  Component Value Date   CHOL 172 03/02/2018   Lab Results  Component Value Date   HDL 53.20 03/02/2018   Lab Results  Component Value Date   LDLCALC 99 03/02/2018   Lab Results  Component Value Date   TRIG 102.0 03/02/2018   Lab Results  Component Value Date   CHOLHDL 3 03/02/2018   Lab Results  Component Value Date   HGBA1C  5.7 03/03/2018    ASSESSMENT AND PLAN:   Health maintenance exam: Reviewed age and gender appropriate health maintenance issues (prudent diet, regular exercise, health risks of tobacco and excessive alcohol, use of seatbelts, fire alarms in home, use of sunscreen).  Also reviewed age and gender appropriate health screening as well as vaccine recommendations. Vaccines: flu UTD.  Tdap UTD 02/2018.  Shingrix-->she'll think about this. Labs: fasting HP and HbA1c Cervical ca screening:pt with hysterectomy in 1987 for benign dx--->no further cerv ca screening indicated. Breast ca screening: mammogram normal 07/01/19-->repeat 1 yr. Colon ca screening: hx of adenomas, recall 2022. Osteoporosis screening: has never had DEXA.  Anticipate getting initial DEXA at the time of next mammogram 06/30/2020->[facility])  An After Visit Summary was printed and given to the patient.  FOLLOW UP:  Return in about 6 months (around 03/05/2020) for routine chronic illness f/u.  Signed:  Crissie Sickles, MD           09/05/2019

## 2019-09-12 ENCOUNTER — Encounter: Payer: Self-pay | Admitting: Family Medicine

## 2019-10-30 ENCOUNTER — Telehealth: Payer: Self-pay | Admitting: Family Medicine

## 2019-10-30 NOTE — Telephone Encounter (Signed)
Patient just had CPE appt on 09/05/19 with labs as well. Placed on PCP desk to review and sign, if appropriate.

## 2019-10-30 NOTE — Telephone Encounter (Signed)
Pt dropped off preoperative clearance form to be completed by Dr. Milinda Cave. Placed form in blue folder for Dr. Milinda Cave in the front office.

## 2019-10-30 NOTE — Telephone Encounter (Signed)
Form completed and handed to [place].

## 2019-11-10 ENCOUNTER — Ambulatory Visit: Payer: Self-pay | Admitting: Orthopedic Surgery

## 2019-11-23 ENCOUNTER — Ambulatory Visit (INDEPENDENT_AMBULATORY_CARE_PROVIDER_SITE_OTHER): Payer: No Typology Code available for payment source | Admitting: Family Medicine

## 2019-11-23 ENCOUNTER — Other Ambulatory Visit: Payer: Self-pay

## 2019-11-23 ENCOUNTER — Encounter: Payer: Self-pay | Admitting: Family Medicine

## 2019-11-23 VITALS — BP 167/87

## 2019-11-23 DIAGNOSIS — N3001 Acute cystitis with hematuria: Secondary | ICD-10-CM

## 2019-11-23 DIAGNOSIS — R300 Dysuria: Secondary | ICD-10-CM | POA: Diagnosis not present

## 2019-11-23 LAB — POC URINALSYSI DIPSTICK (AUTOMATED)
Bilirubin, UA: NEGATIVE
Glucose, UA: NEGATIVE
Ketones, UA: NEGATIVE
Nitrite, UA: POSITIVE
Protein, UA: POSITIVE — AB
Spec Grav, UA: >=1.03 — AB (ref 1.010–1.025)
Urobilinogen, UA: 0.2 E.U./dL
pH, UA: 5.5 (ref 5.0–8.0)

## 2019-11-23 MED ORDER — CIPROFLOXACIN HCL 500 MG PO TABS: 500.0000 mg | ORAL_TABLET | Freq: Two times a day (BID) | ORAL | 0 refills | Status: AC

## 2019-11-23 NOTE — Progress Notes (Signed)
Virtual Visit via Video Note  I connected with pt on 11/23/19 at 11:30 AM EST by a video enabled telemedicine application and verified that I am speaking with the correct person using two identifiers.  Location patient: home Location provider:work or home office Persons participating in the virtual visit: patient, provider  I discussed the limitations of evaluation and management by telemedicine and the availability of in person appointments. The patient expressed understanding and agreed to proceed.  Telemedicine visit is a necessity given the COVID-19 restrictions in place at the current time.  HPI: 59 y/o WF being seen today for urinary complaints. Onset a few days ago UTI sx's. Started getting much worse this morning: dysuria and urinary urgency, frequency (q 10 min).  Some diffuse LB pain, L side mostly.  No radiating pain or colicky pain. Some blood noted in urine, cloudy and foul odor. No otc med tried (no azo).  No fever.  No nausea.  Has not checked bp since last visit here 09/05/19--was normal.  ROS: See pertinent positives and negatives per HPI.  Past Medical History:  Diagnosis Date  . AC separation    Right, grade 3: Dx'd 08/2018  . Allergic dermatitis due to poison vine   . Chronic renal insufficiency, stage 2 (mild) 02/2017   GFR 60s  . Fatty liver 05/2019  . First metacarpal bone fracture 05/2017   base, extraarticular--thumb spica placed and referred pt to orthopedics.  . Genital warts   . History of adenomatous polyp of colon 2011   Repeat colonoscopy 2017 normal.  Recall 2022.  [name] History of blood transfusion 1984  . History of Helicobacter pylori infection remote past  . Hypertension   . Hypothyroidism   . IFG (impaired fasting glucose)    HbA1c 5.7% June 2019  . Osteoarthritis, multiple sites    Knees: L>R--hx of visco injections and multiple steroid injections.  Bilat thumb CMC arthritis and right index PIP jt arthritis.    Past Surgical History:   Procedure Laterality Date  . ABDOMINAL HYSTERECTOMY  1987  . COLONOSCOPY  age 14 and again in 2017   Polyps on initial screening colonoscopy, no polyps 2017.  Recall 2022.  [name] DILATION AND CURETTAGE OF UTERUS  1984   Molar Pregnancy  . LAPAROSCOPIC CHOLECYSTECTOMY  06/2019   [facility]   . OOPHORECTOMY Right 2012  . TONSILLECTOMY  1973  . TREATMENT FISTULA ANAL  2013  . TUBAL LIGATION  1986  . WISDOM TOOTH EXTRACTION  1975    Family History  Problem Relation Age of Onset  . Alcohol abuse Mother   . Stroke Mother   . Hypertension Mother   . Alcohol abuse Father   . Hypertension Father   . COPD Brother   . Lung cancer Maternal Uncle        smoker  . Alcohol abuse Maternal Uncle   . Stroke Maternal Grandmother   . Hypertension Maternal Grandmother   . Hypertension Brother     SOCIAL HX:  Social History   Socioeconomic History  . Marital status: Married    Spouse name: Not on file  . Number of children: Not on file  . Years of education: Not on file  . Highest education level: Not on file  Occupational History  . Not on file  Tobacco Use  . Smoking status: Never Smoker  . Smokeless tobacco: Never Used  Substance and Sexual Activity  . Alcohol use: Yes    Alcohol/week: 7.0 standard drinks  Types: 7 Glasses of wine per week  . Drug use: No  . Sexual activity: Not on file  Other Topics Concern  . Not on file  Social History Narrative   Married, 2 grown children, 2 GC.   Moved to [place] area 12/2016.  Pt originally from [place].   Educ: 2 yr college.   Occup: homemaker   No tobacco.   Occ alcohol.   Social Determinants of Health   Financial Resource Strain:   . Difficulty of Paying Living Expenses:   Food Insecurity:   . Worried About Programme researcher, broadcasting/film/video in the Last Year:   . Barista in the Last Year:   Transportation Needs:   . Freight forwarder (Medical):   [name] Lack of Transportation (Non-Medical):   Physical Activity:   . Days of Exercise per  Week:   . Minutes of Exercise per Session:   Stress:   . Feeling of Stress :   Social Connections:   . Frequency of Communication with Friends and Family:   . Frequency of Social Gatherings with Friends and Family:   . Attends Religious Services:   . Active Member of Clubs or Organizations:   . Attends Banker Meetings:   [name] Marital Status:       Current Outpatient Medications:  .  amLODipine (NORVASC) 5 MG tablet, Take 1 tablet (5 mg total) by mouth daily., Disp: 90 tablet, Rfl: 3 .  levothyroxine (SYNTHROID) 125 MCG tablet, TAKE ONE TABLET BY MOUTH EVERY DAY *NEED APPT FOR MORE REFILLS*, Disp: 90 tablet, Rfl: 3  EXAM: Systolic 155 on recheck bp today. VITALS per patient if applicable: BP (!) 167/87 (BP Location: Left Arm, Patient Position: Sitting, Cuff Size: Large)    GENERAL: alert, oriented, appears well and in no acute distress  HEENT: atraumatic, conjunttiva clear, no obvious abnormalities on inspection of external nose and ears  NECK: normal movements of the head and neck  LUNGS: on inspection no signs of respiratory distress, breathing rate appears normal, no obvious gross SOB, gasping or wheezing  CV: no obvious cyanosis  MS: moves all visible extremities without noticeable abnormality  PSYCH/NEURO: pleasant and cooperative, no obvious depression or anxiety, speech and thought processing grossly intact  LABS: none today    Chemistry      Component Value Date/Time   NA 141 09/05/2019 0903   K 4.4 09/05/2019 0903   CL 106 09/05/2019 0903   CO2 28 09/05/2019 0903   BUN 11 09/05/2019 0903   CREATININE 0.81 09/05/2019 0903      Component Value Date/Time   CALCIUM 9.5 09/05/2019 0903   ALKPHOS 68 09/05/2019 0903   AST 18 09/05/2019 0903   ALT 19 09/05/2019 0903   BILITOT 0.6 09/05/2019 0903     Lab Results  Component Value Date   WBC 6.4 09/05/2019   HGB 14.2 09/05/2019   HCT 44.1 09/05/2019   MCV 90.5 09/05/2019   PLT 255.0 09/05/2019    Lab Results  Component Value Date   TSH 0.41 09/05/2019   Lab Results  Component Value Date   HGBA1C 5.7 09/05/2019   CC POC dipstick UA today: SG >1.030, 3+ blood, large leuks, + nitrite, + protein.  Otherwise normal.  ASSESSMENT AND PLAN:  Discussed the following assessment and plan:  Acute UTI with hematuria. Cipro 500 mg bid x 3-5 days. Send urine for c/s. May use AZO otc for symptom treatment.  I encouraged her to check her  bp a few days in a row after she gets over this infection to make sure it is back in normal range, call if not consistently <130/80.   I discussed the assessment and treatment plan with the patient. The patient was provided an opportunity to ask questions and all were answered. The patient agreed with the plan and demonstrated an understanding of the instructions.   The patient was advised to call back or seek an in-person evaluation if the symptoms worsen or if the condition fails to improve as anticipated.  F/u: if not improving  Signed:  Crissie Sickles, MD           11/23/2019

## 2019-11-25 LAB — URINE CULTURE
MICRO NUMBER:: 10241512
SPECIMEN QUALITY:: ADEQUATE

## 2020-03-05 ENCOUNTER — Ambulatory Visit: Payer: No Typology Code available for payment source | Admitting: Family Medicine

## 2021-07-02 IMAGING — US US ABDOMEN COMPLETE
1 series · 14 of 25 positions shown · non-contrast
Comparison: None.

CLINICAL DATA: Postprandial left upper quadrant abdominal pain.

EXAM:
ABDOMEN ULTRASOUND COMPLETE

[Series 1: us abdomen complete · 0.20mm/px · 14 of 77 slices shown]
[im 1/77]
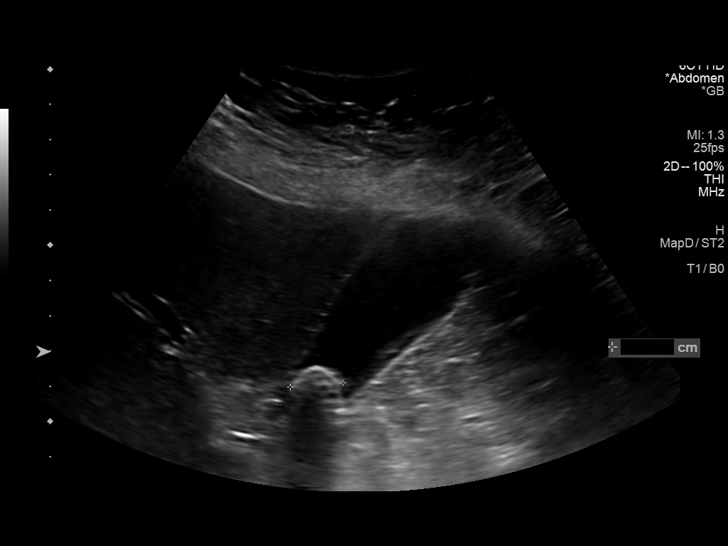
[im 7/77]
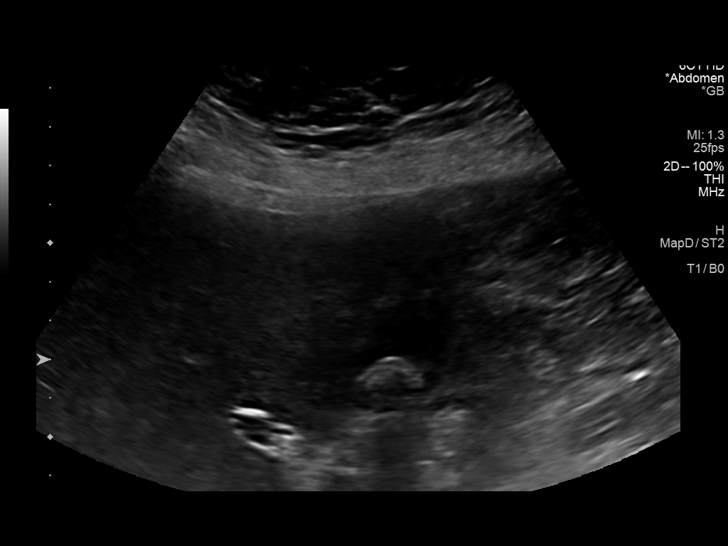
[im 13/77]
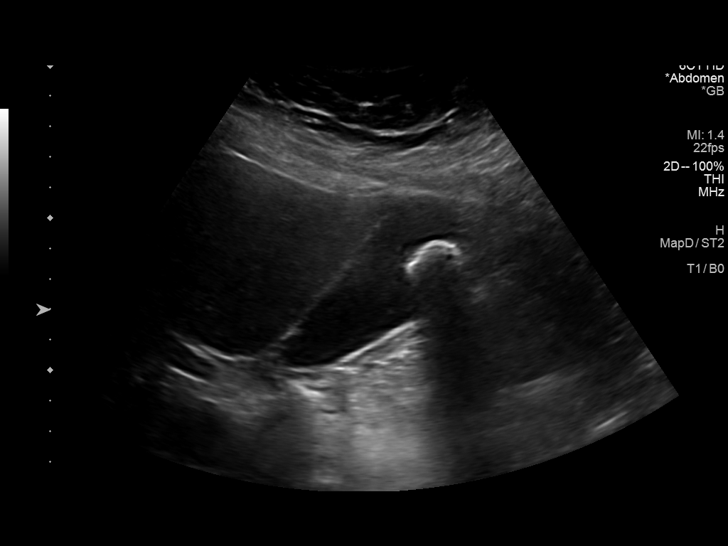
[im 20/77]
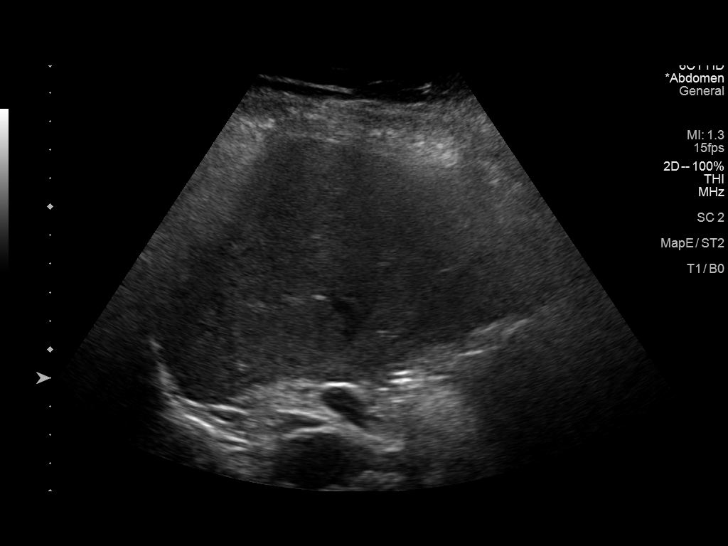
[im 26/77]
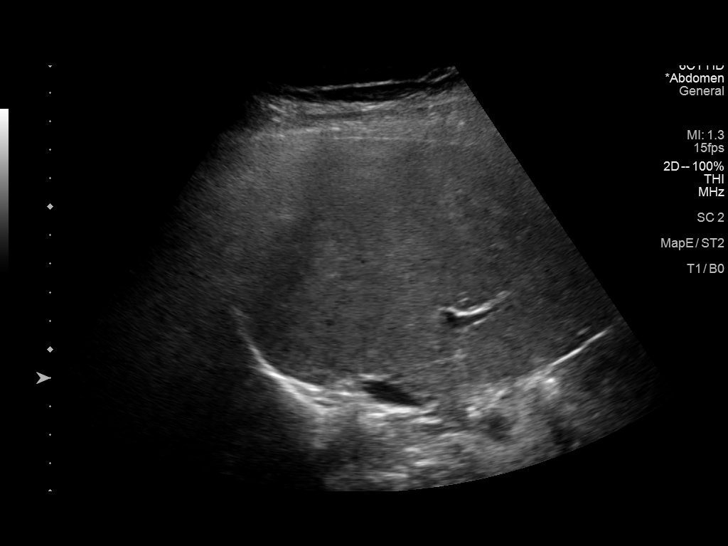
[im 29/77]
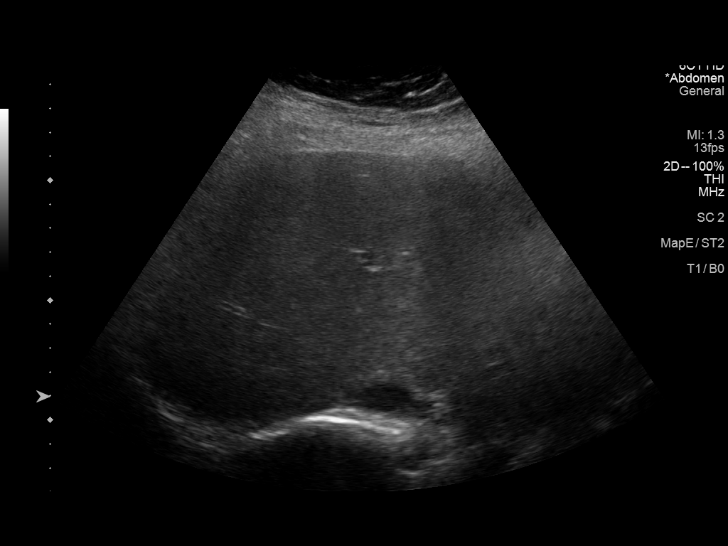
[im 35/77]
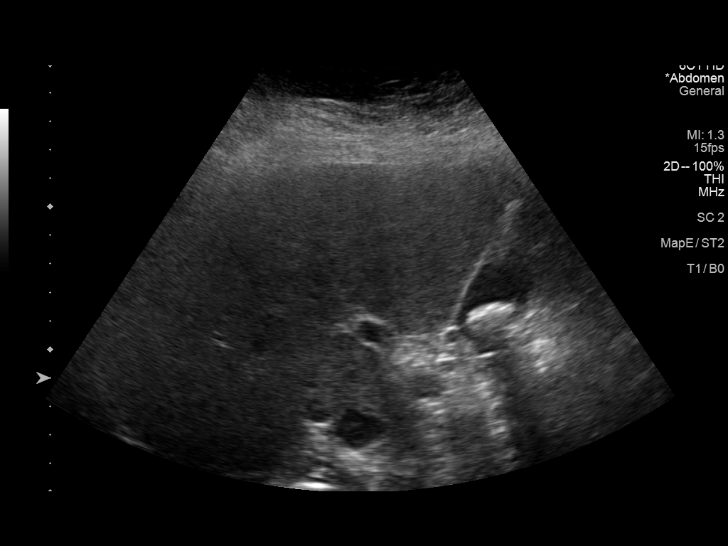
[im 42/77]
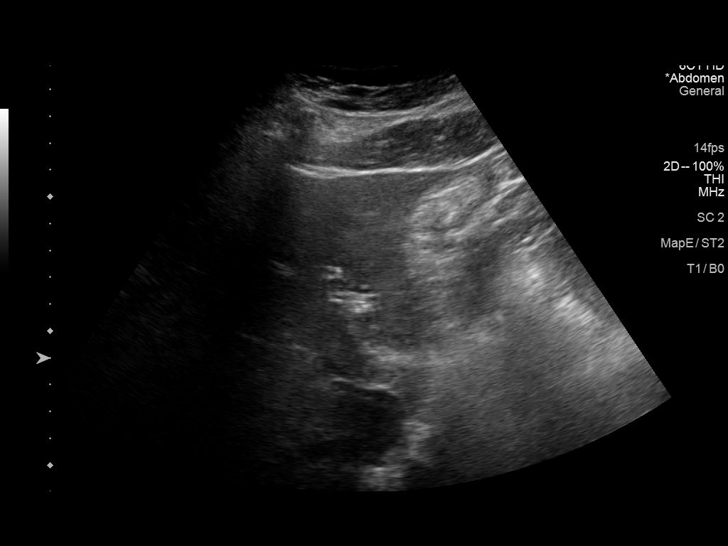
[im 48/77]
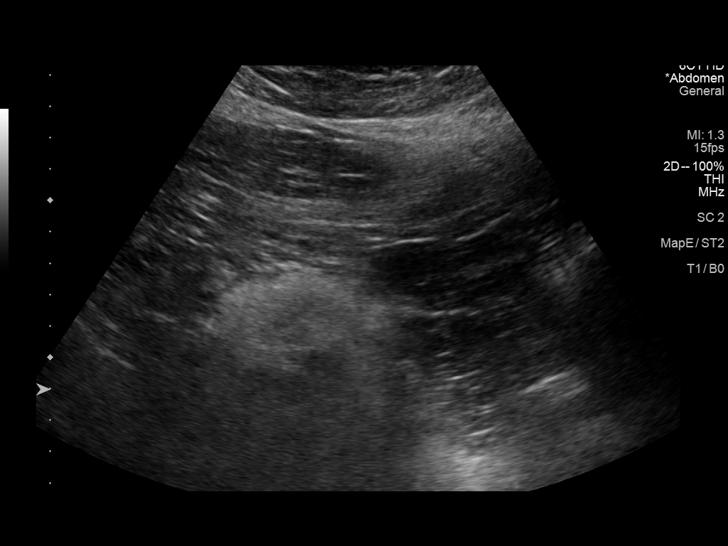
[im 51/77]
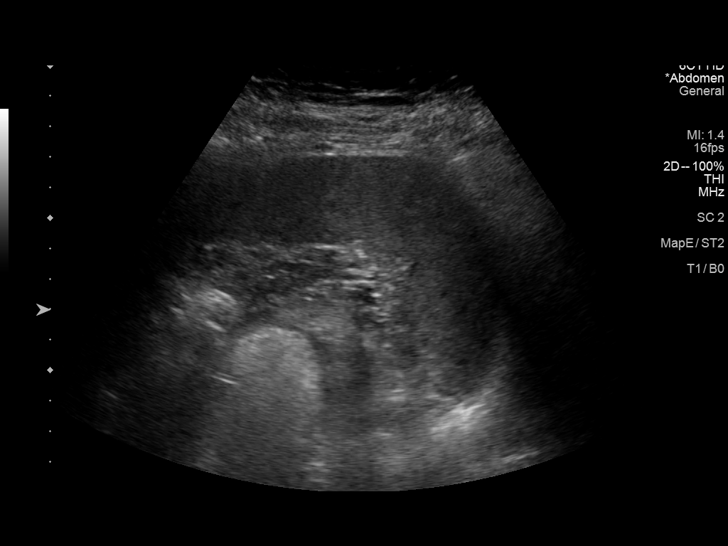
[im 58/77]
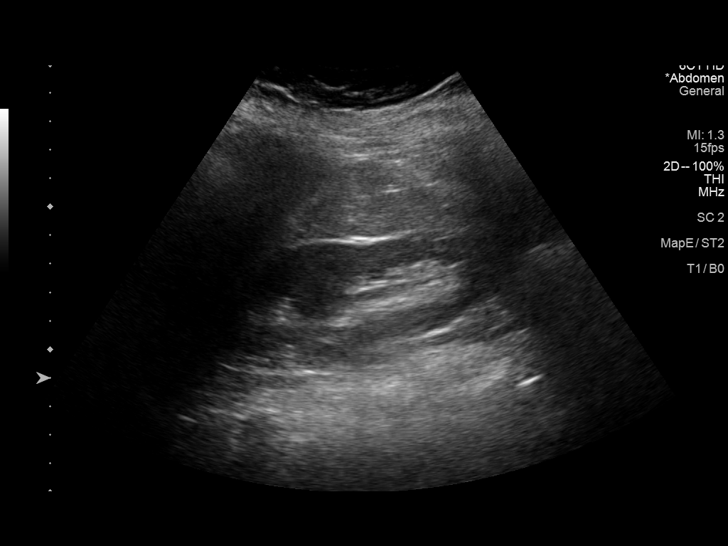
[im 64/77]
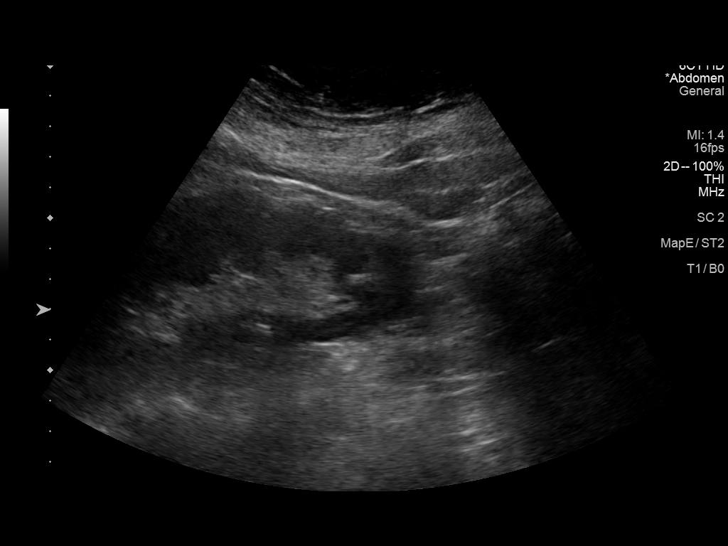
[im 70/77]
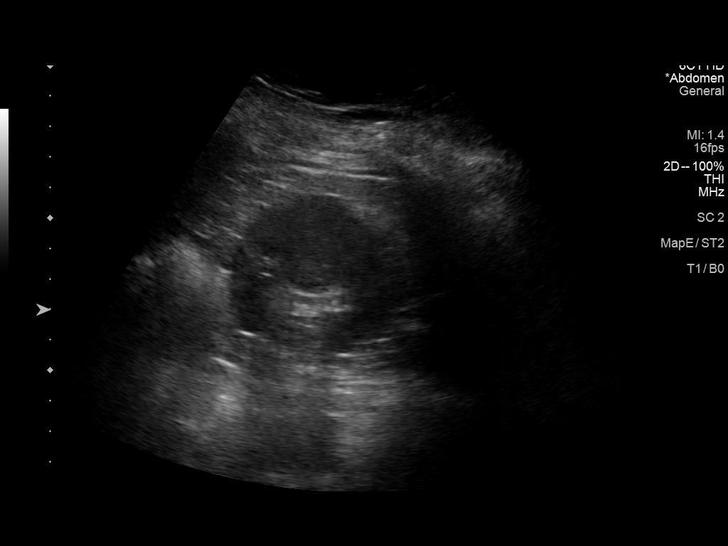
[im 77/77]
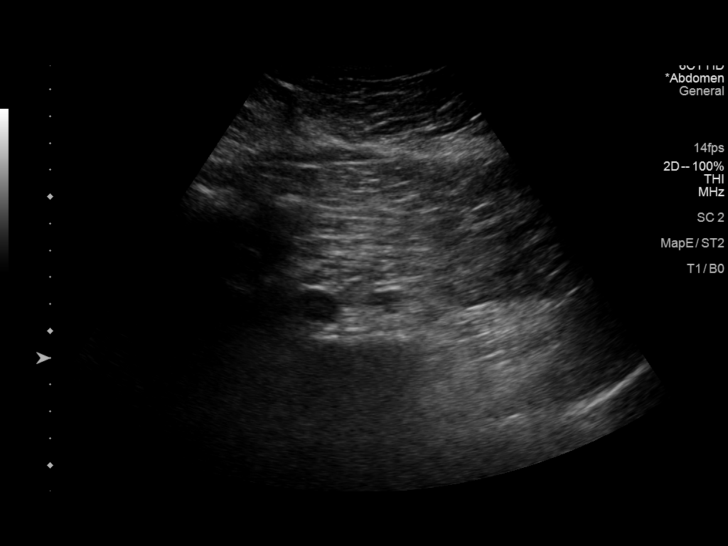

[14 of 25 positions shown; findings below may reference images not displayed]

FINDINGS: Gallbladder: 1.5 cm gallstone is noted. No gallbladder wall
thickening or pericholecystic fluid is noted. No sonographic
Murphy's sign is noted.

Common bile duct: Diameter: 5 mm which is within normal limits.

Liver: No focal lesion identified. Increased echogenicity of hepatic
parenchyma is noted suggesting hepatic steatosis. Portal vein is
patent on color Doppler imaging with normal direction of blood flow
towards the liver.

IVC: No abnormality visualized.

Pancreas: Visualized portion unremarkable.

Spleen: Size and appearance within normal limits.

Right Kidney: Length: 11.5 cm. Echogenicity within normal limits. No
mass or hydronephrosis visualized.

Left Kidney: Length: 12.6 cm. Echogenicity within normal limits. No
mass or hydronephrosis visualized.

Abdominal aorta: No aneurysm visualized.

Other findings: None.
IMPRESSION: Large solitary gallstone.

Probable hepatic steatosis.

No other abnormality seen in the abdomen.

## 2022-03-20 NOTE — Telephone Encounter (Signed)
PCP removed.
